# Patient Record
Sex: Male | Born: 1970 | Race: White | Hispanic: No | Marital: Single | Smoking: Never smoker
Health system: Southern US, Community
[De-identification: ages and names within clinical notes are randomized; demographics above are authoritative.]

---

## 2016-11-05 DIAGNOSIS — L0390 Cellulitis, unspecified: Secondary | ICD-10-CM | POA: Diagnosis not present

## 2017-01-13 DIAGNOSIS — Z23 Encounter for immunization: Secondary | ICD-10-CM | POA: Diagnosis not present

## 2017-01-13 DIAGNOSIS — Z131 Encounter for screening for diabetes mellitus: Secondary | ICD-10-CM | POA: Diagnosis not present

## 2017-01-13 DIAGNOSIS — F17220 Nicotine dependence, chewing tobacco, uncomplicated: Secondary | ICD-10-CM | POA: Diagnosis not present

## 2017-01-13 DIAGNOSIS — E669 Obesity, unspecified: Secondary | ICD-10-CM | POA: Diagnosis not present

## 2017-01-13 DIAGNOSIS — Z6831 Body mass index (BMI) 31.0-31.9, adult: Secondary | ICD-10-CM | POA: Diagnosis not present

## 2017-01-13 DIAGNOSIS — Z0000 Encounter for general adult medical examination without abnormal findings: Secondary | ICD-10-CM | POA: Diagnosis not present

## 2017-01-28 DIAGNOSIS — E11622 Type 2 diabetes mellitus with other skin ulcer: Secondary | ICD-10-CM | POA: Diagnosis not present

## 2017-01-28 DIAGNOSIS — Z1389 Encounter for screening for other disorder: Secondary | ICD-10-CM | POA: Diagnosis not present

## 2017-01-28 DIAGNOSIS — Z6830 Body mass index (BMI) 30.0-30.9, adult: Secondary | ICD-10-CM | POA: Diagnosis not present

## 2017-01-28 DIAGNOSIS — L97322 Non-pressure chronic ulcer of left ankle with fat layer exposed: Secondary | ICD-10-CM | POA: Diagnosis not present

## 2017-02-03 DIAGNOSIS — L97319 Non-pressure chronic ulcer of right ankle with unspecified severity: Secondary | ICD-10-CM | POA: Diagnosis not present

## 2017-02-03 DIAGNOSIS — E11622 Type 2 diabetes mellitus with other skin ulcer: Secondary | ICD-10-CM | POA: Diagnosis not present

## 2017-02-03 DIAGNOSIS — Z7984 Long term (current) use of oral hypoglycemic drugs: Secondary | ICD-10-CM | POA: Diagnosis not present

## 2017-02-03 DIAGNOSIS — E11621 Type 2 diabetes mellitus with foot ulcer: Secondary | ICD-10-CM | POA: Diagnosis not present

## 2017-02-03 DIAGNOSIS — L97322 Non-pressure chronic ulcer of left ankle with fat layer exposed: Secondary | ICD-10-CM | POA: Diagnosis not present

## 2017-02-03 DIAGNOSIS — L97522 Non-pressure chronic ulcer of other part of left foot with fat layer exposed: Secondary | ICD-10-CM | POA: Diagnosis not present

## 2017-02-10 DIAGNOSIS — E11621 Type 2 diabetes mellitus with foot ulcer: Secondary | ICD-10-CM | POA: Diagnosis not present

## 2017-02-10 DIAGNOSIS — E11622 Type 2 diabetes mellitus with other skin ulcer: Secondary | ICD-10-CM | POA: Diagnosis not present

## 2017-02-10 DIAGNOSIS — L97522 Non-pressure chronic ulcer of other part of left foot with fat layer exposed: Secondary | ICD-10-CM | POA: Diagnosis not present

## 2017-02-10 DIAGNOSIS — L97322 Non-pressure chronic ulcer of left ankle with fat layer exposed: Secondary | ICD-10-CM | POA: Diagnosis not present

## 2017-02-17 DIAGNOSIS — L97322 Non-pressure chronic ulcer of left ankle with fat layer exposed: Secondary | ICD-10-CM | POA: Diagnosis not present

## 2017-02-17 DIAGNOSIS — E11621 Type 2 diabetes mellitus with foot ulcer: Secondary | ICD-10-CM | POA: Diagnosis not present

## 2017-02-17 DIAGNOSIS — E11622 Type 2 diabetes mellitus with other skin ulcer: Secondary | ICD-10-CM | POA: Diagnosis not present

## 2017-02-17 DIAGNOSIS — L97522 Non-pressure chronic ulcer of other part of left foot with fat layer exposed: Secondary | ICD-10-CM | POA: Diagnosis not present

## 2017-02-24 DIAGNOSIS — E669 Obesity, unspecified: Secondary | ICD-10-CM | POA: Diagnosis not present

## 2017-02-24 DIAGNOSIS — E1151 Type 2 diabetes mellitus with diabetic peripheral angiopathy without gangrene: Secondary | ICD-10-CM | POA: Diagnosis not present

## 2017-02-24 DIAGNOSIS — L97322 Non-pressure chronic ulcer of left ankle with fat layer exposed: Secondary | ICD-10-CM | POA: Diagnosis not present

## 2017-02-24 DIAGNOSIS — E11622 Type 2 diabetes mellitus with other skin ulcer: Secondary | ICD-10-CM | POA: Diagnosis not present

## 2017-02-24 DIAGNOSIS — L97522 Non-pressure chronic ulcer of other part of left foot with fat layer exposed: Secondary | ICD-10-CM | POA: Diagnosis not present

## 2017-02-24 DIAGNOSIS — I10 Essential (primary) hypertension: Secondary | ICD-10-CM | POA: Diagnosis not present

## 2017-02-24 DIAGNOSIS — E782 Mixed hyperlipidemia: Secondary | ICD-10-CM | POA: Diagnosis not present

## 2017-02-24 DIAGNOSIS — E11621 Type 2 diabetes mellitus with foot ulcer: Secondary | ICD-10-CM | POA: Diagnosis not present

## 2017-02-28 DIAGNOSIS — I82433 Acute embolism and thrombosis of popliteal vein, bilateral: Secondary | ICD-10-CM | POA: Diagnosis not present

## 2017-02-28 DIAGNOSIS — I739 Peripheral vascular disease, unspecified: Secondary | ICD-10-CM | POA: Diagnosis not present

## 2017-02-28 DIAGNOSIS — L97509 Non-pressure chronic ulcer of other part of unspecified foot with unspecified severity: Secondary | ICD-10-CM | POA: Diagnosis not present

## 2017-02-28 DIAGNOSIS — E11621 Type 2 diabetes mellitus with foot ulcer: Secondary | ICD-10-CM | POA: Diagnosis not present

## 2017-03-03 DIAGNOSIS — E11622 Type 2 diabetes mellitus with other skin ulcer: Secondary | ICD-10-CM | POA: Diagnosis not present

## 2017-03-03 DIAGNOSIS — L97322 Non-pressure chronic ulcer of left ankle with fat layer exposed: Secondary | ICD-10-CM | POA: Diagnosis not present

## 2017-03-17 DIAGNOSIS — E11622 Type 2 diabetes mellitus with other skin ulcer: Secondary | ICD-10-CM | POA: Diagnosis not present

## 2017-03-17 DIAGNOSIS — L97322 Non-pressure chronic ulcer of left ankle with fat layer exposed: Secondary | ICD-10-CM | POA: Diagnosis not present

## 2017-03-18 DIAGNOSIS — I82433 Acute embolism and thrombosis of popliteal vein, bilateral: Secondary | ICD-10-CM | POA: Diagnosis not present

## 2017-03-18 DIAGNOSIS — E11622 Type 2 diabetes mellitus with other skin ulcer: Secondary | ICD-10-CM | POA: Diagnosis not present

## 2017-03-18 DIAGNOSIS — L97323 Non-pressure chronic ulcer of left ankle with necrosis of muscle: Secondary | ICD-10-CM | POA: Diagnosis not present

## 2017-03-18 DIAGNOSIS — E11621 Type 2 diabetes mellitus with foot ulcer: Secondary | ICD-10-CM | POA: Diagnosis not present

## 2017-03-22 DIAGNOSIS — E11621 Type 2 diabetes mellitus with foot ulcer: Secondary | ICD-10-CM | POA: Diagnosis not present

## 2017-03-22 DIAGNOSIS — I739 Peripheral vascular disease, unspecified: Secondary | ICD-10-CM | POA: Diagnosis not present

## 2017-03-22 DIAGNOSIS — L97529 Non-pressure chronic ulcer of other part of left foot with unspecified severity: Secondary | ICD-10-CM | POA: Diagnosis not present

## 2017-03-22 DIAGNOSIS — L97929 Non-pressure chronic ulcer of unspecified part of left lower leg with unspecified severity: Secondary | ICD-10-CM | POA: Diagnosis not present

## 2017-03-31 DIAGNOSIS — E11622 Type 2 diabetes mellitus with other skin ulcer: Secondary | ICD-10-CM | POA: Diagnosis not present

## 2017-03-31 DIAGNOSIS — L97322 Non-pressure chronic ulcer of left ankle with fat layer exposed: Secondary | ICD-10-CM | POA: Diagnosis not present

## 2017-04-07 DIAGNOSIS — L97309 Non-pressure chronic ulcer of unspecified ankle with unspecified severity: Secondary | ICD-10-CM | POA: Diagnosis not present

## 2017-04-07 DIAGNOSIS — E1151 Type 2 diabetes mellitus with diabetic peripheral angiopathy without gangrene: Secondary | ICD-10-CM | POA: Diagnosis not present

## 2017-04-07 DIAGNOSIS — E782 Mixed hyperlipidemia: Secondary | ICD-10-CM | POA: Diagnosis not present

## 2017-04-07 DIAGNOSIS — E11622 Type 2 diabetes mellitus with other skin ulcer: Secondary | ICD-10-CM | POA: Diagnosis not present

## 2017-04-07 DIAGNOSIS — L97322 Non-pressure chronic ulcer of left ankle with fat layer exposed: Secondary | ICD-10-CM | POA: Diagnosis not present

## 2017-04-14 DIAGNOSIS — L97322 Non-pressure chronic ulcer of left ankle with fat layer exposed: Secondary | ICD-10-CM | POA: Diagnosis not present

## 2017-04-14 DIAGNOSIS — E11622 Type 2 diabetes mellitus with other skin ulcer: Secondary | ICD-10-CM | POA: Diagnosis not present

## 2017-04-21 DIAGNOSIS — E11622 Type 2 diabetes mellitus with other skin ulcer: Secondary | ICD-10-CM | POA: Diagnosis not present

## 2017-04-21 DIAGNOSIS — L97322 Non-pressure chronic ulcer of left ankle with fat layer exposed: Secondary | ICD-10-CM | POA: Diagnosis not present

## 2017-04-21 DIAGNOSIS — M25572 Pain in left ankle and joints of left foot: Secondary | ICD-10-CM | POA: Diagnosis not present

## 2017-04-25 DIAGNOSIS — E11621 Type 2 diabetes mellitus with foot ulcer: Secondary | ICD-10-CM | POA: Diagnosis not present

## 2017-04-25 DIAGNOSIS — E11622 Type 2 diabetes mellitus with other skin ulcer: Secondary | ICD-10-CM | POA: Diagnosis not present

## 2017-04-25 DIAGNOSIS — L97329 Non-pressure chronic ulcer of left ankle with unspecified severity: Secondary | ICD-10-CM | POA: Diagnosis not present

## 2017-04-26 DIAGNOSIS — E11621 Type 2 diabetes mellitus with foot ulcer: Secondary | ICD-10-CM | POA: Diagnosis not present

## 2017-04-26 DIAGNOSIS — L97329 Non-pressure chronic ulcer of left ankle with unspecified severity: Secondary | ICD-10-CM | POA: Diagnosis not present

## 2017-04-27 DIAGNOSIS — L97326 Non-pressure chronic ulcer of left ankle with bone involvement without evidence of necrosis: Secondary | ICD-10-CM | POA: Diagnosis not present

## 2017-04-27 DIAGNOSIS — E11621 Type 2 diabetes mellitus with foot ulcer: Secondary | ICD-10-CM | POA: Diagnosis not present

## 2017-04-27 DIAGNOSIS — E11622 Type 2 diabetes mellitus with other skin ulcer: Secondary | ICD-10-CM | POA: Diagnosis not present

## 2017-04-27 DIAGNOSIS — L97329 Non-pressure chronic ulcer of left ankle with unspecified severity: Secondary | ICD-10-CM | POA: Diagnosis not present

## 2017-04-28 DIAGNOSIS — E11621 Type 2 diabetes mellitus with foot ulcer: Secondary | ICD-10-CM | POA: Diagnosis not present

## 2017-04-28 DIAGNOSIS — L97329 Non-pressure chronic ulcer of left ankle with unspecified severity: Secondary | ICD-10-CM | POA: Diagnosis not present

## 2017-04-29 DIAGNOSIS — E11621 Type 2 diabetes mellitus with foot ulcer: Secondary | ICD-10-CM | POA: Diagnosis not present

## 2017-04-29 DIAGNOSIS — L97326 Non-pressure chronic ulcer of left ankle with bone involvement without evidence of necrosis: Secondary | ICD-10-CM | POA: Diagnosis not present

## 2017-04-29 DIAGNOSIS — L97329 Non-pressure chronic ulcer of left ankle with unspecified severity: Secondary | ICD-10-CM | POA: Diagnosis not present

## 2017-04-29 DIAGNOSIS — E11622 Type 2 diabetes mellitus with other skin ulcer: Secondary | ICD-10-CM | POA: Diagnosis not present

## 2017-04-30 DIAGNOSIS — L97329 Non-pressure chronic ulcer of left ankle with unspecified severity: Secondary | ICD-10-CM | POA: Diagnosis not present

## 2017-04-30 DIAGNOSIS — E11621 Type 2 diabetes mellitus with foot ulcer: Secondary | ICD-10-CM | POA: Diagnosis not present

## 2017-05-01 DIAGNOSIS — L97329 Non-pressure chronic ulcer of left ankle with unspecified severity: Secondary | ICD-10-CM | POA: Diagnosis not present

## 2017-05-01 DIAGNOSIS — E11621 Type 2 diabetes mellitus with foot ulcer: Secondary | ICD-10-CM | POA: Diagnosis not present

## 2017-05-02 DIAGNOSIS — L97326 Non-pressure chronic ulcer of left ankle with bone involvement without evidence of necrosis: Secondary | ICD-10-CM | POA: Diagnosis not present

## 2017-05-02 DIAGNOSIS — E11622 Type 2 diabetes mellitus with other skin ulcer: Secondary | ICD-10-CM | POA: Diagnosis not present

## 2017-05-02 DIAGNOSIS — L97329 Non-pressure chronic ulcer of left ankle with unspecified severity: Secondary | ICD-10-CM | POA: Diagnosis not present

## 2017-05-02 DIAGNOSIS — E11621 Type 2 diabetes mellitus with foot ulcer: Secondary | ICD-10-CM | POA: Diagnosis not present

## 2017-05-03 DIAGNOSIS — L97329 Non-pressure chronic ulcer of left ankle with unspecified severity: Secondary | ICD-10-CM | POA: Diagnosis not present

## 2017-05-03 DIAGNOSIS — E11621 Type 2 diabetes mellitus with foot ulcer: Secondary | ICD-10-CM | POA: Diagnosis not present

## 2017-05-04 DIAGNOSIS — E11621 Type 2 diabetes mellitus with foot ulcer: Secondary | ICD-10-CM | POA: Diagnosis not present

## 2017-05-04 DIAGNOSIS — E11622 Type 2 diabetes mellitus with other skin ulcer: Secondary | ICD-10-CM | POA: Diagnosis not present

## 2017-05-04 DIAGNOSIS — L97322 Non-pressure chronic ulcer of left ankle with fat layer exposed: Secondary | ICD-10-CM | POA: Diagnosis not present

## 2017-05-04 DIAGNOSIS — L97329 Non-pressure chronic ulcer of left ankle with unspecified severity: Secondary | ICD-10-CM | POA: Diagnosis not present

## 2017-05-05 DIAGNOSIS — L97329 Non-pressure chronic ulcer of left ankle with unspecified severity: Secondary | ICD-10-CM | POA: Diagnosis not present

## 2017-05-05 DIAGNOSIS — E11621 Type 2 diabetes mellitus with foot ulcer: Secondary | ICD-10-CM | POA: Diagnosis not present

## 2017-05-06 DIAGNOSIS — L97322 Non-pressure chronic ulcer of left ankle with fat layer exposed: Secondary | ICD-10-CM | POA: Diagnosis not present

## 2017-05-06 DIAGNOSIS — E11622 Type 2 diabetes mellitus with other skin ulcer: Secondary | ICD-10-CM | POA: Diagnosis not present

## 2017-05-10 DIAGNOSIS — L97328 Non-pressure chronic ulcer of left ankle with other specified severity: Secondary | ICD-10-CM | POA: Diagnosis not present

## 2017-05-10 DIAGNOSIS — E11622 Type 2 diabetes mellitus with other skin ulcer: Secondary | ICD-10-CM | POA: Diagnosis not present

## 2017-05-10 DIAGNOSIS — L97322 Non-pressure chronic ulcer of left ankle with fat layer exposed: Secondary | ICD-10-CM | POA: Diagnosis not present

## 2017-05-12 DIAGNOSIS — M86172 Other acute osteomyelitis, left ankle and foot: Secondary | ICD-10-CM | POA: Diagnosis not present

## 2017-05-12 DIAGNOSIS — L97322 Non-pressure chronic ulcer of left ankle with fat layer exposed: Secondary | ICD-10-CM | POA: Diagnosis not present

## 2017-05-12 DIAGNOSIS — E11622 Type 2 diabetes mellitus with other skin ulcer: Secondary | ICD-10-CM | POA: Diagnosis not present

## 2017-05-13 DIAGNOSIS — I739 Peripheral vascular disease, unspecified: Secondary | ICD-10-CM | POA: Diagnosis not present

## 2017-05-13 DIAGNOSIS — Z79899 Other long term (current) drug therapy: Secondary | ICD-10-CM | POA: Diagnosis not present

## 2017-05-13 DIAGNOSIS — I771 Stricture of artery: Secondary | ICD-10-CM | POA: Diagnosis not present

## 2017-05-19 ENCOUNTER — Telehealth: Payer: Self-pay | Admitting: *Deleted

## 2017-05-19 ENCOUNTER — Ambulatory Visit (INDEPENDENT_AMBULATORY_CARE_PROVIDER_SITE_OTHER): Payer: BLUE CROSS/BLUE SHIELD | Admitting: Internal Medicine

## 2017-05-19 ENCOUNTER — Encounter: Payer: Self-pay | Admitting: Internal Medicine

## 2017-05-19 DIAGNOSIS — E11621 Type 2 diabetes mellitus with foot ulcer: Secondary | ICD-10-CM | POA: Diagnosis not present

## 2017-05-19 DIAGNOSIS — L97522 Non-pressure chronic ulcer of other part of left foot with fat layer exposed: Secondary | ICD-10-CM | POA: Diagnosis not present

## 2017-05-19 DIAGNOSIS — L97322 Non-pressure chronic ulcer of left ankle with fat layer exposed: Secondary | ICD-10-CM | POA: Diagnosis not present

## 2017-05-19 DIAGNOSIS — M869 Osteomyelitis, unspecified: Secondary | ICD-10-CM | POA: Insufficient documentation

## 2017-05-19 DIAGNOSIS — M86272 Subacute osteomyelitis, left ankle and foot: Secondary | ICD-10-CM | POA: Diagnosis not present

## 2017-05-19 DIAGNOSIS — E11622 Type 2 diabetes mellitus with other skin ulcer: Secondary | ICD-10-CM | POA: Diagnosis not present

## 2017-05-19 NOTE — Telephone Encounter (Signed)
Darl PikesSusan at [facility] is having difficulty getting in touch with the patient to schedule his PICC placement. She is asking for alternative contact information. She will continue to call him.  RN gave Darl PikesSusan the patient's emergency contact number (mother Coy SaunasRosemary). Andree CossHowell, Octivia Canion M, RN

## 2017-05-19 NOTE — Telephone Encounter (Signed)
Spoke to Vernona RiegerLaura at [facility] and order faxed to [phone] for picc line to be placed tomorrow afternoon. [facility] Ancef 2 grams [facility]. Orders and notes also faxed to [facility] for IV medication management and they will use an outside agency for nursing.

## 2017-05-19 NOTE — Telephone Encounter (Signed)
Called patient and IR was able to reach him. Picc is to be placed tomorrow at 1:30 pm. [facility] is still working on obtaining nursing. They are checking to see if Pam can meet [facility] at [facility] to [facility] teaching.

## 2017-05-19 NOTE — Progress Notes (Signed)
[facility] for Infectious Disease      Reason for Consult: osteomyelitis of left lateral malleolus    Referring Physician: Lawanda Cousins, NP    Patient ID: Gabriel Peters, male    DOB: February 10, 1971, 46 y.o.   MRN: [id number]  HPI:   Here for evaluation of osteomyelitis.  In May he bumped his left lateral malleolus at work and developed an ulcer.  He went to the doctor and found out he was diabetic.  The ulcer worsened and in June he was given empiric doxycycline for 2 weeks but the ulcer has not healed.  Initial xrays did not suggest osteomyelitis but follow up MRI last week c/w osteomyelitis.  No significant drainage of the area but is open.  He was also found to have bilateral peripheral arterial disease and sees Dr. Earleen Newport and underwent an arterial procedure.  He has had no associated fever, chills.  No history of osteomyelitis.    Previous record reviewed including the MRI report confirming osteomyelitis.   PMH: DM, osteomyelitis  Prior to Admission medications   Medication Sig Start Date End Date Taking? Authorizing Provider  atorvastatin (LIPITOR) 40 MG tablet Take 40 mg by mouth every evening. 04/08/17  Yes [provider]  Blood Glucose Monitoring Suppl (ONE TOUCH ULTRA 2) w/Device KIT AS DIRECTED,ONCE DAILY 04/25/17  Yes [provider]  clopidogrel (PLAVIX) 75 MG tablet Take 75 mg by mouth daily. 05/14/17  Yes [provider]  doxycycline (VIBRAMYCIN) 100 MG capsule Take 100 mg by mouth 2 (two) times daily. 05/10/17  Yes [provider]  losartan (COZAAR) 50 MG tablet Take 50 mg by mouth daily.   Yes [provider]  metFORMIN (GLUCOPHAGE) 850 MG tablet Take 850 mg by mouth 2 (two) times daily. 04/08/17  Yes [provider]  ONE TOUCH ULTRA TEST test strip AS DIRECTED, ONCE DAILY 04/25/17  Yes [provider]  Kayenta AS DIRECTED ONCE DAILY 04/25/17  Yes [provider]  SANTYL ointment   03/21/17  Yes [provider]  traMADol Veatrice Bourbon) 50 MG tablet  05/16/17  Yes [provider]    No Known Allergies  Social History  Substance Use Topics  . Smoking status: Never Smoker  . Smokeless tobacco: Current User    Types: Snuff  . Alcohol use Yes     Comment: occasional    [place]: cardiac disease  Review of Systems  Constitutional: negative for fatigue, malaise and anorexia Gastrointestinal: negative for diarrhea Integument/breast: negative for rash All other systems reviewed and are negative    Constitutional: in no apparent distress, well developed and well nourished and alert  Vitals:   05/19/17 1023  BP: (!) 144/85  Pulse: (!) 125  Temp: 98.1 F (36.7 C)   EYES: anicteric ENMT: no thrush Cardiovascular: Cor RRR Respiratory: CTA B; normal respiratory effort GI: Bowel sounds are normal, liver is not enlarged, spleen is not enlarged Musculoskeletal: no pedal edema noted, left lateral malleolus with open wound about 1 cm Skin: negatives: no rash Hematologic: no cervical lad  Labs: No results found for: WBC, HGB, HCT, MCV, PLT No results found for: CREATININE, BUN, NA, K, CL, CO2 No results found for: ALT, AST, GGT, ALKPHOS, BILITOT, INR   Assessment: subacute osteomyelitis.  I discussed treatment options.  No bone biopsy has been done so no definitive culture.   Wound swab with MSSA.     Plan: 1) baseline CMP, cbc, ESR, CRP 2) picc  line 3) [facility] for IV ancef for 4 weeks through October 11 Follow up with me in 3-4 weeks

## 2017-05-20 DIAGNOSIS — L02818 Cutaneous abscess of other sites: Secondary | ICD-10-CM | POA: Diagnosis not present

## 2017-05-20 DIAGNOSIS — M869 Osteomyelitis, unspecified: Secondary | ICD-10-CM | POA: Diagnosis not present

## 2017-05-20 DIAGNOSIS — M868X7 Other osteomyelitis, ankle and foot: Secondary | ICD-10-CM | POA: Diagnosis not present

## 2017-05-20 LAB — COMPREHENSIVE METABOLIC PANEL
AG RATIO: 1.6 (calc) (ref 1.0–2.5)
ALKALINE PHOSPHATASE (APISO): 84 U/L (ref 40–115)
ALT: 18 U/L (ref 9–46)
AST: 15 U/L (ref 10–40)
Albumin: 4.4 g/dL (ref 3.6–5.1)
BILIRUBIN TOTAL: 0.5 mg/dL (ref 0.2–1.2)
BUN: 22 mg/dL (ref 7–25)
CALCIUM: 9.5 mg/dL (ref 8.6–10.3)
CO2: 25 mmol/L (ref 20–32)
Chloride: 100 mmol/L (ref 98–110)
Creat: 0.76 mg/dL (ref 0.60–1.35)
Globulin: 2.7 g/dL (calc) (ref 1.9–3.7)
Glucose, Bld: 225 mg/dL — ABNORMAL HIGH (ref 65–99)
POTASSIUM: 4.2 mmol/L (ref 3.5–5.3)
SODIUM: 139 mmol/L (ref 135–146)
TOTAL PROTEIN: 7.1 g/dL (ref 6.1–8.1)

## 2017-05-20 LAB — CBC WITH DIFFERENTIAL/PLATELET
BASOS ABS: 105 {cells}/uL (ref 0–200)
Basophils Relative: 0.9 %
EOS PCT: 2.7 %
Eosinophils Absolute: 316 cells/uL (ref 15–500)
HEMATOCRIT: 46.3 % (ref 38.5–50.0)
HEMOGLOBIN: 15.4 g/dL (ref 13.2–17.1)
LYMPHS ABS: 3428 {cells}/uL (ref 850–3900)
MCH: 29.4 pg (ref 27.0–33.0)
MCHC: 33.3 g/dL (ref 32.0–36.0)
MCV: 88.5 fL (ref 80.0–100.0)
MPV: 10.5 fL (ref 7.5–12.5)
Monocytes Relative: 6.7 %
NEUTROS ABS: 7067 {cells}/uL (ref 1500–7800)
NEUTROS PCT: 60.4 %
Platelets: 282 10*3/uL (ref 140–400)
RBC: 5.23 10*6/uL (ref 4.20–5.80)
RDW: 12 % (ref 11.0–15.0)
Total Lymphocyte: 29.3 %
WBC: 11.7 10*3/uL — ABNORMAL HIGH (ref 3.8–10.8)
WBCMIX: 784 {cells}/uL (ref 200–950)

## 2017-05-20 LAB — C-REACTIVE PROTEIN: CRP: 10.3 mg/L — ABNORMAL HIGH (ref <8.0)

## 2017-05-20 LAB — SEDIMENTATION RATE: Sed Rate: 31 mm/h — ABNORMAL HIGH (ref 0–15)

## 2017-05-20 NOTE — Telephone Encounter (Signed)
[facility] is going to see patient on Sunday 05/22/17 and Pam is meeting him today for training.

## 2017-05-21 DIAGNOSIS — M86172 Other acute osteomyelitis, left ankle and foot: Secondary | ICD-10-CM | POA: Diagnosis not present

## 2017-05-22 DIAGNOSIS — L02818 Cutaneous abscess of other sites: Secondary | ICD-10-CM | POA: Diagnosis not present

## 2017-05-22 DIAGNOSIS — M869 Osteomyelitis, unspecified: Secondary | ICD-10-CM | POA: Diagnosis not present

## 2017-05-25 ENCOUNTER — Other Ambulatory Visit: Payer: Self-pay | Admitting: Pharmacist

## 2017-05-26 DIAGNOSIS — L97322 Non-pressure chronic ulcer of left ankle with fat layer exposed: Secondary | ICD-10-CM | POA: Diagnosis not present

## 2017-05-26 DIAGNOSIS — L97522 Non-pressure chronic ulcer of other part of left foot with fat layer exposed: Secondary | ICD-10-CM | POA: Diagnosis not present

## 2017-05-26 DIAGNOSIS — E11622 Type 2 diabetes mellitus with other skin ulcer: Secondary | ICD-10-CM | POA: Diagnosis not present

## 2017-05-26 DIAGNOSIS — E11621 Type 2 diabetes mellitus with foot ulcer: Secondary | ICD-10-CM | POA: Diagnosis not present

## 2017-05-27 DIAGNOSIS — L02818 Cutaneous abscess of other sites: Secondary | ICD-10-CM | POA: Diagnosis not present

## 2017-05-30 DIAGNOSIS — Z452 Encounter for adjustment and management of vascular access device: Secondary | ICD-10-CM | POA: Diagnosis not present

## 2017-05-30 DIAGNOSIS — E11622 Type 2 diabetes mellitus with other skin ulcer: Secondary | ICD-10-CM | POA: Diagnosis not present

## 2017-05-31 DIAGNOSIS — M8610 Other acute osteomyelitis, unspecified site: Secondary | ICD-10-CM | POA: Diagnosis not present

## 2017-05-31 DIAGNOSIS — M869 Osteomyelitis, unspecified: Secondary | ICD-10-CM | POA: Diagnosis not present

## 2017-06-01 ENCOUNTER — Other Ambulatory Visit: Payer: Self-pay | Admitting: Pharmacist

## 2017-06-01 DIAGNOSIS — E11621 Type 2 diabetes mellitus with foot ulcer: Secondary | ICD-10-CM | POA: Diagnosis not present

## 2017-06-01 DIAGNOSIS — E11622 Type 2 diabetes mellitus with other skin ulcer: Secondary | ICD-10-CM | POA: Diagnosis not present

## 2017-06-01 DIAGNOSIS — L97522 Non-pressure chronic ulcer of other part of left foot with fat layer exposed: Secondary | ICD-10-CM | POA: Diagnosis not present

## 2017-06-01 DIAGNOSIS — L97322 Non-pressure chronic ulcer of left ankle with fat layer exposed: Secondary | ICD-10-CM | POA: Diagnosis not present

## 2017-06-06 DIAGNOSIS — L02818 Cutaneous abscess of other sites: Secondary | ICD-10-CM | POA: Diagnosis not present

## 2017-06-06 DIAGNOSIS — M8610 Other acute osteomyelitis, unspecified site: Secondary | ICD-10-CM | POA: Diagnosis not present

## 2017-06-06 DIAGNOSIS — M869 Osteomyelitis, unspecified: Secondary | ICD-10-CM | POA: Diagnosis not present

## 2017-06-08 ENCOUNTER — Other Ambulatory Visit: Payer: Self-pay | Admitting: Pharmacist

## 2017-06-08 DIAGNOSIS — E11621 Type 2 diabetes mellitus with foot ulcer: Secondary | ICD-10-CM | POA: Diagnosis not present

## 2017-06-08 DIAGNOSIS — L97322 Non-pressure chronic ulcer of left ankle with fat layer exposed: Secondary | ICD-10-CM | POA: Diagnosis not present

## 2017-06-08 DIAGNOSIS — L97522 Non-pressure chronic ulcer of other part of left foot with fat layer exposed: Secondary | ICD-10-CM | POA: Diagnosis not present

## 2017-06-08 DIAGNOSIS — E11622 Type 2 diabetes mellitus with other skin ulcer: Secondary | ICD-10-CM | POA: Diagnosis not present

## 2017-06-10 ENCOUNTER — Other Ambulatory Visit: Payer: Self-pay | Admitting: Pharmacist

## 2017-06-10 DIAGNOSIS — L02818 Cutaneous abscess of other sites: Secondary | ICD-10-CM | POA: Diagnosis not present

## 2017-06-13 ENCOUNTER — Telehealth: Payer: Self-pay | Admitting: *Deleted

## 2017-06-13 ENCOUNTER — Other Ambulatory Visit: Payer: Self-pay | Admitting: *Deleted

## 2017-06-13 DIAGNOSIS — L97522 Non-pressure chronic ulcer of other part of left foot with fat layer exposed: Secondary | ICD-10-CM | POA: Diagnosis not present

## 2017-06-13 DIAGNOSIS — L97322 Non-pressure chronic ulcer of left ankle with fat layer exposed: Secondary | ICD-10-CM | POA: Diagnosis not present

## 2017-06-13 DIAGNOSIS — E11621 Type 2 diabetes mellitus with foot ulcer: Secondary | ICD-10-CM | POA: Diagnosis not present

## 2017-06-13 DIAGNOSIS — E11622 Type 2 diabetes mellitus with other skin ulcer: Secondary | ICD-10-CM | POA: Diagnosis not present

## 2017-06-13 MED ORDER — CEPHALEXIN 500 MG PO CAPS: 500.0000 mg | ORAL_CAPSULE | Freq: Four times a day (QID) | ORAL | 0 refills | Status: DC

## 2017-06-13 NOTE — Telephone Encounter (Addendum)
Patient cancelled his appt for 06/16/17 because of a test he is having done at Dr. Gabriel Rung office. He thinks it may be a doppler. His end date for IV antibiotic is 06/16/17 and his follow up is 06/22/17. Can picc line be pulled?

## 2017-06-13 NOTE — Telephone Encounter (Signed)
Verbal order per Dr. Luciana Axe given to [facility] at [facility] to pull patient's picc line at the end of IV antibiotics on 06/16/17. Left patient a voice mail to find out what pharmacy to send the keflex to.

## 2017-06-13 NOTE — Telephone Encounter (Signed)
Rx for Keflex sent to [facility] in [place] and patient notified to start this after the picc line is pulled on 06/16/17.

## 2017-06-13 NOTE — Telephone Encounter (Signed)
Yes, pull the picc line.  Have him start Keflex 500 mg 4 times per day after the picc line is pulled for 14 days and we can reassess at the appt.  thanks

## 2017-06-14 DIAGNOSIS — L02818 Cutaneous abscess of other sites: Secondary | ICD-10-CM | POA: Diagnosis not present

## 2017-06-14 DIAGNOSIS — E119 Type 2 diabetes mellitus without complications: Secondary | ICD-10-CM | POA: Diagnosis not present

## 2017-06-14 DIAGNOSIS — M86272 Subacute osteomyelitis, left ankle and foot: Secondary | ICD-10-CM | POA: Diagnosis not present

## 2017-06-14 DIAGNOSIS — L97322 Non-pressure chronic ulcer of left ankle with fat layer exposed: Secondary | ICD-10-CM | POA: Diagnosis not present

## 2017-06-14 DIAGNOSIS — L97329 Non-pressure chronic ulcer of left ankle with unspecified severity: Secondary | ICD-10-CM | POA: Diagnosis not present

## 2017-06-14 DIAGNOSIS — E11622 Type 2 diabetes mellitus with other skin ulcer: Secondary | ICD-10-CM | POA: Diagnosis not present

## 2017-06-16 ENCOUNTER — Ambulatory Visit: Payer: BLUE CROSS/BLUE SHIELD | Admitting: Internal Medicine

## 2017-06-16 DIAGNOSIS — L97322 Non-pressure chronic ulcer of left ankle with fat layer exposed: Secondary | ICD-10-CM | POA: Diagnosis not present

## 2017-06-16 DIAGNOSIS — L97522 Non-pressure chronic ulcer of other part of left foot with fat layer exposed: Secondary | ICD-10-CM | POA: Diagnosis not present

## 2017-06-16 DIAGNOSIS — E11621 Type 2 diabetes mellitus with foot ulcer: Secondary | ICD-10-CM | POA: Diagnosis not present

## 2017-06-16 DIAGNOSIS — L89529 Pressure ulcer of left ankle, unspecified stage: Secondary | ICD-10-CM | POA: Diagnosis not present

## 2017-06-16 DIAGNOSIS — L02818 Cutaneous abscess of other sites: Secondary | ICD-10-CM | POA: Diagnosis not present

## 2017-06-16 DIAGNOSIS — Z9289 Personal history of other medical treatment: Secondary | ICD-10-CM | POA: Diagnosis not present

## 2017-06-16 DIAGNOSIS — E11622 Type 2 diabetes mellitus with other skin ulcer: Secondary | ICD-10-CM | POA: Diagnosis not present

## 2017-06-16 DIAGNOSIS — I739 Peripheral vascular disease, unspecified: Secondary | ICD-10-CM | POA: Diagnosis not present

## 2017-06-22 ENCOUNTER — Ambulatory Visit (INDEPENDENT_AMBULATORY_CARE_PROVIDER_SITE_OTHER): Payer: BLUE CROSS/BLUE SHIELD | Admitting: Internal Medicine

## 2017-06-22 ENCOUNTER — Encounter: Payer: Self-pay | Admitting: Internal Medicine

## 2017-06-22 VITALS — BP 130/89 | HR 111 | Temp 98.9°F | Wt 223.0 lb

## 2017-06-22 DIAGNOSIS — L97509 Non-pressure chronic ulcer of other part of unspecified foot with unspecified severity: Secondary | ICD-10-CM | POA: Diagnosis not present

## 2017-06-22 DIAGNOSIS — M86272 Subacute osteomyelitis, left ankle and foot: Secondary | ICD-10-CM | POA: Diagnosis not present

## 2017-06-22 DIAGNOSIS — E11621 Type 2 diabetes mellitus with foot ulcer: Secondary | ICD-10-CM

## 2017-06-22 DIAGNOSIS — E11622 Type 2 diabetes mellitus with other skin ulcer: Secondary | ICD-10-CM | POA: Diagnosis not present

## 2017-06-22 DIAGNOSIS — L97322 Non-pressure chronic ulcer of left ankle with fat layer exposed: Secondary | ICD-10-CM | POA: Diagnosis not present

## 2017-06-22 NOTE — Progress Notes (Signed)
   Subjective:    Patient ID: Gabriel Peters, male    DOB: 07/14/1971, 46 y.o.   MRN: [id number]  HPI Here for follow up of subacute osteomyelitis.   He completed 6 weeks of IV cefazolin and improved his wound.  Inflammatory markers normalized from minimally elevated.  Wound is shrinking.  No associated n/v.    Review of Systems  Constitutional: Negative for fatigue and fever.  Gastrointestinal: Negative for diarrhea.  Skin: Negative for rash.       Objective:   Physical Exam  Constitutional: He appears well-developed and well-nourished. No distress.  Eyes: No scleral icterus.  Cardiovascular: Normal rate, regular rhythm and normal heart sounds.   No murmur heard. Lymphadenopathy:    He has no cervical adenopathy.  Skin: No rash noted.          Assessment & Plan:

## 2017-06-22 NOTE — Assessment & Plan Note (Signed)
Now wound is healing, inflammatory markers ok no surrounding erythema on picture suggesting healed osteomyelitis.   rtc as needed

## 2017-06-22 NOTE — Assessment & Plan Note (Signed)
Needs continued good diabetes control

## 2017-06-29 DIAGNOSIS — E11622 Type 2 diabetes mellitus with other skin ulcer: Secondary | ICD-10-CM | POA: Diagnosis not present

## 2017-06-29 DIAGNOSIS — L97322 Non-pressure chronic ulcer of left ankle with fat layer exposed: Secondary | ICD-10-CM | POA: Diagnosis not present

## 2017-07-06 DIAGNOSIS — L97322 Non-pressure chronic ulcer of left ankle with fat layer exposed: Secondary | ICD-10-CM | POA: Diagnosis not present

## 2017-07-06 DIAGNOSIS — E11622 Type 2 diabetes mellitus with other skin ulcer: Secondary | ICD-10-CM | POA: Diagnosis not present

## 2017-07-13 DIAGNOSIS — E11622 Type 2 diabetes mellitus with other skin ulcer: Secondary | ICD-10-CM | POA: Diagnosis not present

## 2017-07-13 DIAGNOSIS — L97322 Non-pressure chronic ulcer of left ankle with fat layer exposed: Secondary | ICD-10-CM | POA: Diagnosis not present

## 2017-07-20 DIAGNOSIS — L97322 Non-pressure chronic ulcer of left ankle with fat layer exposed: Secondary | ICD-10-CM | POA: Diagnosis not present

## 2017-07-20 DIAGNOSIS — E11622 Type 2 diabetes mellitus with other skin ulcer: Secondary | ICD-10-CM | POA: Diagnosis not present

## 2017-08-03 DIAGNOSIS — E11622 Type 2 diabetes mellitus with other skin ulcer: Secondary | ICD-10-CM | POA: Diagnosis not present

## 2017-08-03 DIAGNOSIS — L97329 Non-pressure chronic ulcer of left ankle with unspecified severity: Secondary | ICD-10-CM | POA: Diagnosis not present

## 2017-10-14 DIAGNOSIS — E669 Obesity, unspecified: Secondary | ICD-10-CM | POA: Diagnosis not present

## 2017-10-14 DIAGNOSIS — I10 Essential (primary) hypertension: Secondary | ICD-10-CM | POA: Diagnosis not present

## 2017-10-14 DIAGNOSIS — E1151 Type 2 diabetes mellitus with diabetic peripheral angiopathy without gangrene: Secondary | ICD-10-CM | POA: Diagnosis not present

## 2017-10-14 DIAGNOSIS — E782 Mixed hyperlipidemia: Secondary | ICD-10-CM | POA: Diagnosis not present

## 2018-01-17 DIAGNOSIS — I771 Stricture of artery: Secondary | ICD-10-CM | POA: Diagnosis not present

## 2018-01-17 DIAGNOSIS — I739 Peripheral vascular disease, unspecified: Secondary | ICD-10-CM | POA: Diagnosis not present

## 2018-10-12 DIAGNOSIS — I10 Essential (primary) hypertension: Secondary | ICD-10-CM | POA: Diagnosis not present

## 2018-10-12 DIAGNOSIS — E782 Mixed hyperlipidemia: Secondary | ICD-10-CM | POA: Diagnosis not present

## 2018-10-12 DIAGNOSIS — Z23 Encounter for immunization: Secondary | ICD-10-CM | POA: Diagnosis not present

## 2018-10-12 DIAGNOSIS — M722 Plantar fascial fibromatosis: Secondary | ICD-10-CM | POA: Diagnosis not present

## 2018-10-12 DIAGNOSIS — E1151 Type 2 diabetes mellitus with diabetic peripheral angiopathy without gangrene: Secondary | ICD-10-CM | POA: Diagnosis not present

## 2018-11-14 DIAGNOSIS — I739 Peripheral vascular disease, unspecified: Secondary | ICD-10-CM | POA: Diagnosis not present

## 2019-01-15 DIAGNOSIS — E669 Obesity, unspecified: Secondary | ICD-10-CM | POA: Diagnosis not present

## 2019-01-15 DIAGNOSIS — I10 Essential (primary) hypertension: Secondary | ICD-10-CM | POA: Diagnosis not present

## 2019-01-15 DIAGNOSIS — E782 Mixed hyperlipidemia: Secondary | ICD-10-CM | POA: Diagnosis not present

## 2019-01-15 DIAGNOSIS — E1151 Type 2 diabetes mellitus with diabetic peripheral angiopathy without gangrene: Secondary | ICD-10-CM | POA: Diagnosis not present

## 2020-02-11 DIAGNOSIS — E782 Mixed hyperlipidemia: Secondary | ICD-10-CM | POA: Diagnosis not present

## 2020-02-11 DIAGNOSIS — E1151 Type 2 diabetes mellitus with diabetic peripheral angiopathy without gangrene: Secondary | ICD-10-CM | POA: Diagnosis not present

## 2020-02-11 DIAGNOSIS — I10 Essential (primary) hypertension: Secondary | ICD-10-CM | POA: Diagnosis not present

## 2020-06-09 DIAGNOSIS — E782 Mixed hyperlipidemia: Secondary | ICD-10-CM | POA: Diagnosis not present

## 2020-06-09 DIAGNOSIS — I10 Essential (primary) hypertension: Secondary | ICD-10-CM | POA: Diagnosis not present

## 2020-06-09 DIAGNOSIS — E1151 Type 2 diabetes mellitus with diabetic peripheral angiopathy without gangrene: Secondary | ICD-10-CM | POA: Diagnosis not present

## 2020-06-09 DIAGNOSIS — Z2821 Immunization not carried out because of patient refusal: Secondary | ICD-10-CM | POA: Diagnosis not present

## 2020-09-24 DIAGNOSIS — R051 Acute cough: Secondary | ICD-10-CM | POA: Diagnosis not present

## 2020-09-24 DIAGNOSIS — Z20828 Contact with and (suspected) exposure to other viral communicable diseases: Secondary | ICD-10-CM | POA: Diagnosis not present

## 2020-09-24 DIAGNOSIS — R509 Fever, unspecified: Secondary | ICD-10-CM | POA: Diagnosis not present

## 2021-10-21 DIAGNOSIS — E11622 Type 2 diabetes mellitus with other skin ulcer: Secondary | ICD-10-CM | POA: Diagnosis not present

## 2021-10-21 DIAGNOSIS — L97309 Non-pressure chronic ulcer of unspecified ankle with unspecified severity: Secondary | ICD-10-CM | POA: Diagnosis not present

## 2021-10-21 DIAGNOSIS — E1151 Type 2 diabetes mellitus with diabetic peripheral angiopathy without gangrene: Secondary | ICD-10-CM | POA: Diagnosis not present

## 2021-10-21 DIAGNOSIS — E782 Mixed hyperlipidemia: Secondary | ICD-10-CM | POA: Diagnosis not present

## 2021-10-29 DIAGNOSIS — E11622 Type 2 diabetes mellitus with other skin ulcer: Secondary | ICD-10-CM | POA: Diagnosis not present

## 2021-10-29 DIAGNOSIS — L97329 Non-pressure chronic ulcer of left ankle with unspecified severity: Secondary | ICD-10-CM | POA: Diagnosis not present

## 2021-11-03 DIAGNOSIS — L97329 Non-pressure chronic ulcer of left ankle with unspecified severity: Secondary | ICD-10-CM | POA: Diagnosis not present

## 2021-11-03 DIAGNOSIS — E11622 Type 2 diabetes mellitus with other skin ulcer: Secondary | ICD-10-CM | POA: Diagnosis not present

## 2021-11-10 DIAGNOSIS — L97528 Non-pressure chronic ulcer of other part of left foot with other specified severity: Secondary | ICD-10-CM | POA: Diagnosis not present

## 2021-11-10 DIAGNOSIS — E11622 Type 2 diabetes mellitus with other skin ulcer: Secondary | ICD-10-CM | POA: Diagnosis not present

## 2021-11-10 DIAGNOSIS — E11621 Type 2 diabetes mellitus with foot ulcer: Secondary | ICD-10-CM | POA: Diagnosis not present

## 2021-11-12 DIAGNOSIS — E11622 Type 2 diabetes mellitus with other skin ulcer: Secondary | ICD-10-CM | POA: Diagnosis not present

## 2021-11-12 DIAGNOSIS — E11621 Type 2 diabetes mellitus with foot ulcer: Secondary | ICD-10-CM | POA: Diagnosis not present

## 2021-11-12 DIAGNOSIS — L97528 Non-pressure chronic ulcer of other part of left foot with other specified severity: Secondary | ICD-10-CM | POA: Diagnosis not present

## 2021-11-18 DIAGNOSIS — L97528 Non-pressure chronic ulcer of other part of left foot with other specified severity: Secondary | ICD-10-CM | POA: Diagnosis not present

## 2021-11-18 DIAGNOSIS — E11621 Type 2 diabetes mellitus with foot ulcer: Secondary | ICD-10-CM | POA: Diagnosis not present

## 2021-11-18 DIAGNOSIS — E11622 Type 2 diabetes mellitus with other skin ulcer: Secondary | ICD-10-CM | POA: Diagnosis not present

## 2021-11-23 DIAGNOSIS — I70213 Atherosclerosis of native arteries of extremities with intermittent claudication, bilateral legs: Secondary | ICD-10-CM | POA: Diagnosis not present

## 2021-11-25 DIAGNOSIS — E11622 Type 2 diabetes mellitus with other skin ulcer: Secondary | ICD-10-CM | POA: Diagnosis not present

## 2021-11-25 DIAGNOSIS — L97529 Non-pressure chronic ulcer of other part of left foot with unspecified severity: Secondary | ICD-10-CM | POA: Diagnosis not present

## 2021-11-25 DIAGNOSIS — M19072 Primary osteoarthritis, left ankle and foot: Secondary | ICD-10-CM | POA: Diagnosis not present

## 2021-11-25 DIAGNOSIS — E11621 Type 2 diabetes mellitus with foot ulcer: Secondary | ICD-10-CM | POA: Diagnosis not present

## 2021-11-25 DIAGNOSIS — L97522 Non-pressure chronic ulcer of other part of left foot with fat layer exposed: Secondary | ICD-10-CM | POA: Diagnosis not present

## 2021-11-25 DIAGNOSIS — E1151 Type 2 diabetes mellitus with diabetic peripheral angiopathy without gangrene: Secondary | ICD-10-CM | POA: Diagnosis not present

## 2021-11-25 DIAGNOSIS — L97528 Non-pressure chronic ulcer of other part of left foot with other specified severity: Secondary | ICD-10-CM | POA: Diagnosis not present

## 2021-11-25 DIAGNOSIS — R600 Localized edema: Secondary | ICD-10-CM | POA: Diagnosis not present

## 2021-11-26 ENCOUNTER — Other Ambulatory Visit (HOSPITAL_COMMUNITY): Payer: Self-pay | Admitting: Interventional Radiology

## 2021-11-26 DIAGNOSIS — S91302A Unspecified open wound, left foot, initial encounter: Secondary | ICD-10-CM

## 2021-12-01 ENCOUNTER — Other Ambulatory Visit: Payer: Self-pay | Admitting: Radiology

## 2021-12-02 ENCOUNTER — Other Ambulatory Visit (HOSPITAL_COMMUNITY): Payer: Self-pay | Admitting: Interventional Radiology

## 2021-12-02 ENCOUNTER — Inpatient Hospital Stay (HOSPITAL_COMMUNITY): Admit: 2021-12-02 | Payer: BC Managed Care – PPO

## 2021-12-02 ENCOUNTER — Other Ambulatory Visit: Payer: Self-pay

## 2021-12-02 ENCOUNTER — Encounter (HOSPITAL_COMMUNITY): Payer: Self-pay

## 2021-12-02 ENCOUNTER — Observation Stay (HOSPITAL_COMMUNITY)
Admission: RE | Admit: 2021-12-02 | Discharge: 2021-12-03 | Disposition: A | Discharge status: Home / Self Care | Payer: BC Managed Care – PPO | Source: Ambulatory Visit | Attending: Interventional Radiology | Admitting: Interventional Radiology

## 2021-12-02 DIAGNOSIS — S91302A Unspecified open wound, left foot, initial encounter: Secondary | ICD-10-CM

## 2021-12-02 DIAGNOSIS — Z7984 Long term (current) use of oral hypoglycemic drugs: Secondary | ICD-10-CM | POA: Insufficient documentation

## 2021-12-02 DIAGNOSIS — E11621 Type 2 diabetes mellitus with foot ulcer: Secondary | ICD-10-CM | POA: Diagnosis not present

## 2021-12-02 DIAGNOSIS — L97529 Non-pressure chronic ulcer of other part of left foot with unspecified severity: Secondary | ICD-10-CM | POA: Insufficient documentation

## 2021-12-02 DIAGNOSIS — Z79899 Other long term (current) drug therapy: Secondary | ICD-10-CM | POA: Diagnosis not present

## 2021-12-02 DIAGNOSIS — F17290 Nicotine dependence, other tobacco product, uncomplicated: Secondary | ICD-10-CM | POA: Diagnosis not present

## 2021-12-02 DIAGNOSIS — I70262 Atherosclerosis of native arteries of extremities with gangrene, left leg: Secondary | ICD-10-CM | POA: Insufficient documentation

## 2021-12-02 DIAGNOSIS — E1152 Type 2 diabetes mellitus with diabetic peripheral angiopathy with gangrene: Secondary | ICD-10-CM | POA: Diagnosis not present

## 2021-12-02 DIAGNOSIS — Z7982 Long term (current) use of aspirin: Secondary | ICD-10-CM | POA: Diagnosis not present

## 2021-12-02 DIAGNOSIS — M869 Osteomyelitis, unspecified: Secondary | ICD-10-CM | POA: Diagnosis not present

## 2021-12-02 DIAGNOSIS — I70248 Atherosclerosis of native arteries of left leg with ulceration of other part of lower left leg: Secondary | ICD-10-CM | POA: Diagnosis not present

## 2021-12-02 DIAGNOSIS — I70229 Atherosclerosis of native arteries of extremities with rest pain, unspecified extremity: Secondary | ICD-10-CM | POA: Insufficient documentation

## 2021-12-02 HISTORY — PX: IR ANGIOGRAM SELECTIVE EACH ADDITIONAL VESSEL: IMG667

## 2021-12-02 HISTORY — PX: IR US GUIDE VASC ACCESS LEFT: IMG2389

## 2021-12-02 HISTORY — PX: IR ANGIOGRAM EXTREMITY LEFT: IMG651

## 2021-12-02 HISTORY — PX: IR TIB-PERO ART ATHEREC INC PTA MOD SED: IMG2314

## 2021-12-02 LAB — CBC
HCT: 38.8 % — ABNORMAL LOW (ref 39.0–52.0)
Hemoglobin: 12.9 g/dL — ABNORMAL LOW (ref 13.0–17.0)
MCH: 30.3 pg (ref 26.0–34.0)
MCHC: 33.2 g/dL (ref 30.0–36.0)
MCV: 91.1 fL (ref 80.0–100.0)
Platelets: 257 10*3/uL (ref 150–400)
RBC: 4.26 MIL/uL (ref 4.22–5.81)
RDW: 12.8 % (ref 11.5–15.5)
WBC: 9.1 10*3/uL (ref 4.0–10.5)
nRBC: 0 % (ref 0.0–0.2)

## 2021-12-02 LAB — BASIC METABOLIC PANEL
Anion gap: 10 (ref 5–15)
BUN: 18 mg/dL (ref 6–20)
CO2: 21 mmol/L — ABNORMAL LOW (ref 22–32)
Calcium: 8.9 mg/dL (ref 8.9–10.3)
Chloride: 109 mmol/L (ref 98–111)
Creatinine, Ser: 1.46 mg/dL — ABNORMAL HIGH (ref 0.61–1.24)
GFR, Estimated: 58 mL/min — ABNORMAL LOW (ref >60)
Glucose, Bld: 200 mg/dL — ABNORMAL HIGH (ref 70–99)
Potassium: 3.6 mmol/L (ref 3.5–5.1)
Sodium: 140 mmol/L (ref 135–145)

## 2021-12-02 LAB — PROTIME-INR
INR: 1.1 (ref 0.8–1.2)
Prothrombin Time: 13.9 seconds (ref 11.4–15.2)

## 2021-12-02 LAB — GLUCOSE, CAPILLARY: Glucose-Capillary: 176 mg/dL — ABNORMAL HIGH (ref 70–99)

## 2021-12-02 MED ORDER — MIDAZOLAM HCL 2 MG/2ML IJ SOLN
INTRAMUSCULAR | Status: AC
  Filled 2021-12-02: qty 2

## 2021-12-02 MED ORDER — HEPARIN SODIUM (PORCINE) 1000 UNIT/ML IJ SOLN
INTRAMUSCULAR | Status: AC | PRN
Start: 2021-12-02 — End: 2021-12-02
  Administered 2021-12-02: 2000 [IU] via INTRAVENOUS
  Administered 2021-12-02: 8000 [IU] via INTRAVENOUS
  Administered 2021-12-02: 2000 [IU] via INTRAVENOUS

## 2021-12-02 MED ORDER — ASPIRIN 325 MG PO TABS: 650.0000 mg | ORAL_TABLET | Freq: Once | ORAL | Status: AC

## 2021-12-02 MED ORDER — ATORVASTATIN CALCIUM 40 MG PO TABS
40.0000 mg | ORAL_TABLET | Freq: Every evening | ORAL | Status: DC
  Administered 2021-12-02: 40 mg via ORAL
  Filled 2021-12-02: qty 1

## 2021-12-02 MED ORDER — CLOPIDOGREL BISULFATE 300 MG PO TABS
ORAL_TABLET | ORAL | Status: AC
  Administered 2021-12-02: 300 mg via ORAL
  Filled 2021-12-02: qty 1

## 2021-12-02 MED ORDER — FENTANYL CITRATE (PF) 100 MCG/2ML IJ SOLN
INTRAMUSCULAR | Status: AC
  Filled 2021-12-02: qty 2

## 2021-12-02 MED ORDER — INSULIN ASPART 100 UNIT/ML IJ SOLN
0.0000 [IU] | Freq: Three times a day (TID) | INTRAMUSCULAR | Status: DC
  Administered 2021-12-02 – 2021-12-03 (×3): 3 [IU] via SUBCUTANEOUS

## 2021-12-02 MED ORDER — NITROGLYCERIN 1 MG/10 ML FOR IR/CATH LAB
INTRA_ARTERIAL | Status: AC | PRN
  Administered 2021-12-02: 200 ug via INTRA_ARTERIAL

## 2021-12-02 MED ORDER — IODIXANOL 320 MG/ML IV SOLN: 100.0000 mL | Freq: Once | INTRAVENOUS | Status: DC | PRN

## 2021-12-02 MED ORDER — MELOXICAM 7.5 MG PO TABS: 15.0000 mg | ORAL_TABLET | Freq: Every day | ORAL | Status: DC | PRN

## 2021-12-02 MED ORDER — LIDOCAINE HCL 1 % IJ SOLN
INTRAMUSCULAR | Status: AC
  Filled 2021-12-02: qty 20

## 2021-12-02 MED ORDER — SODIUM CHLORIDE 0.9 % IV SOLN: INTRAVENOUS | Status: DC

## 2021-12-02 MED ORDER — ALTEPLASE 2 MG IJ SOLR
INTRAMUSCULAR | Status: AC | PRN
  Administered 2021-12-02 (×2): 2 mg

## 2021-12-02 MED ORDER — CLOPIDOGREL BISULFATE 75 MG PO TABS: 300.0000 mg | ORAL_TABLET | Freq: Once | ORAL | Status: AC

## 2021-12-02 MED ORDER — NITROGLYCERIN 1 MG/10 ML FOR IR/CATH LAB
INTRA_ARTERIAL | Status: AC
  Filled 2021-12-02: qty 10

## 2021-12-02 MED ORDER — FENTANYL CITRATE (PF) 100 MCG/2ML IJ SOLN
INTRAMUSCULAR | Status: AC | PRN
  Administered 2021-12-02: 50 ug via INTRAVENOUS
  Administered 2021-12-02: 25 ug via INTRAVENOUS
  Administered 2021-12-02: 50 ug via INTRAVENOUS
  Administered 2021-12-02 (×3): 25 ug via INTRAVENOUS

## 2021-12-02 MED ORDER — CLOPIDOGREL BISULFATE 75 MG PO TABS: 75.0000 mg | ORAL_TABLET | Freq: Every day | ORAL | 0 refills | Status: AC

## 2021-12-02 MED ORDER — INSULIN ASPART 100 UNIT/ML IJ SOLN
4.0000 [IU] | Freq: Three times a day (TID) | INTRAMUSCULAR | Status: DC
  Administered 2021-12-02 – 2021-12-03 (×3): 4 [IU] via SUBCUTANEOUS

## 2021-12-02 MED ORDER — HEPARIN SODIUM (PORCINE) 1000 UNIT/ML IJ SOLN
INTRAMUSCULAR | Status: AC
  Filled 2021-12-02: qty 10

## 2021-12-02 MED ORDER — HYDROCODONE-ACETAMINOPHEN 5-325 MG PO TABS: 1.0000 | ORAL_TABLET | ORAL | Status: DC | PRN

## 2021-12-02 MED ORDER — ASPIRIN 325 MG PO TABS
ORAL_TABLET | ORAL | Status: AC
  Administered 2021-12-02: 650 mg via ORAL
  Filled 2021-12-02: qty 2

## 2021-12-02 MED ORDER — HEPARIN (PORCINE) 25000 UT/250ML-% IV SOLN
1800.0000 [IU]/h | INTRAVENOUS | Status: DC
  Administered 2021-12-02: 1500 [IU]/h via INTRAVENOUS
  Filled 2021-12-02 (×3): qty 250

## 2021-12-02 MED ORDER — MIDAZOLAM HCL 2 MG/2ML IJ SOLN
INTRAMUSCULAR | Status: AC | PRN
  Administered 2021-12-02: .5 mg via INTRAVENOUS
  Administered 2021-12-02 (×2): 1 mg via INTRAVENOUS
  Administered 2021-12-02 (×3): .5 mg via INTRAVENOUS

## 2021-12-02 MED ORDER — ALTEPLASE 2 MG IJ SOLR
INTRAMUSCULAR | Status: AC
  Filled 2021-12-02: qty 4

## 2021-12-02 MED ORDER — ASPIRIN EC 81 MG PO TBEC
81.0000 mg | DELAYED_RELEASE_TABLET | Freq: Every day | ORAL | Status: DC
  Administered 2021-12-03: 81 mg via ORAL
  Filled 2021-12-02: qty 1

## 2021-12-02 NOTE — Progress Notes (Addendum)
ANTICOAGULATION CONSULT NOTE ? ?Pharmacy Consult for Heparin ?Indication: left tibial artery occlusions ? ?No Known Allergies ? ?Patient Measurements: ?Height: 5\' 10"  (177.8 cm) ?Weight: 101.2 kg (223 lb 1.7 oz) ?IBW/kg (Calculated) : 73 ? ?Heparin Dosing Weight: 94 kg ? ?Vital Signs: ?Temp: 98.1 ?F (36.7 ?C) (03/29 0935) ?Temp Source: Oral (03/29 0935) ?BP: 131/89 (03/29 1700) ?Pulse Rate: 76 (03/29 1700) ? ?Labs: ?Recent Labs  ?  12/02/21 ?0950  ?HGB 12.9*  ?HCT 38.8*  ?PLT 257  ?LABPROT 13.9  ?INR 1.1  ?CREATININE 1.46*  ? ? ?Estimated Creatinine Clearance: 72.2 mL/min (A) (by C-G formula based on SCr of 1.46 mg/dL (H)). ? ? ?Assessment: ?51 y.o. male with past medical history of DM, PAD on DAPT, and chronic non-healing food wounds who presented with LLE gangrene of the forefoot, great toe and second toe. Left lower extremity angiogram shoed occlusions of AT, PT, peroneal arteries at the origins. Pharmacy consulted to initiate heparin.  ? ?During IR procedure, patient received total of heparin 44 units bolus. Will forego bolus. CBC stable, platelets 257.  ? ? ?Goal of Therapy:  ?Heparin level 0.3-0.7 units/ml ?Monitor platelets by anticoagulation protocol: Yes ?  ?Plan:  ?Start heparin infusion at 1500 units/hr ?Check heparin level in 6 hours and daily while on heparin ?Continue to monitor H&H and platelets ? ? ? ?Thank you for allowing pharmacy to be a part of this patient?s care. ? ?[id number], PharmD ?Clinical Pharmacist ? ?

## 2021-12-02 NOTE — Sedation Documentation (Signed)
6 fr celt deployed left groin ? ?

## 2021-12-02 NOTE — Sedation Documentation (Signed)
Patient is resting comfortably. 

## 2021-12-02 NOTE — Procedures (Signed)
Interventional Radiology Procedure Note ? ?Procedure: US guided left CFA access.  US guided left PT and AT access.  ?Left lower extremity angiogram.  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  ?Laser atherectomy and PTA of the occluded PT.  ? ?Findings: ?Occlusions of AT, PT, peroneal arteries at the origins.  ?Restoration of flow through the PT. At remains occluded.  ? ?Post: ?Doppler neg AT unchanged ?+ Doppler PT. ?  ?Forefoot with some mottling, coolness and slow cap refill ?  ?Complications: None ? ?Recommendations:  ?- Based on the forefoot appearance will plan for 23 hr observation ?- start heparin ggt ?- [facility] diet ?- continue dual antiplatelet medications ?- routine wound care  ?- symptom control ?- float heel ?- ice to the left hip prn ? ?Signed, ? ?Yvone Neu. Loreta Ave, DO ? ? ?

## 2021-12-02 NOTE — H&P (Signed)
? ?Chief Complaint: ?Patient was seen in consultation today for non-healing left foot wound ? ?Supervising Physician: Corrie Mckusick ? ?Patient Status: [facility] - Out-pt ? ?History of Present Illness: ?Gabriel Peters is a 51 y.o. male with past medical history of poorly controlled DM and chronic non-healing food wounds complicated by osteomyelitis.  He has undergone prior angiogram with intervention with Dr. Earleen Newport to improve blood flow and has been following with [place] without improvement.  He presents to Radiology today for repeat angiogram with angioplasty vs. Stenting for PAD.  ? ?Gabriel Peters presents in his usual state of health today.  He has been NPO.  He took his medications per his usual schedule including ASA 38m.  He states the wounds to his left foot continue to get worse.  ? ?History reviewed. No pertinent past medical history. ? ?History reviewed. No pertinent surgical history. ? ?Allergies: ?Patient has no known allergies. ? ?Medications: ?Prior to Admission medications   ?Medication Sig Start Date End Date Taking? Authorizing Provider  ?acetaminophen (TYLENOL) 500 MG tablet Take 1,000 mg by mouth daily as needed for moderate pain.   Yes [provider]  ?Aromatic Inhalants (VAPOR INHALER IN) Inhale 1 puff into the lungs daily as needed (congestion).   Yes [provider]  ?aspirin EC 81 MG tablet Take 81 mg by mouth daily. Swallow whole.   Yes [provider]  ?atorvastatin (LIPITOR) 40 MG tablet Take 40 mg by mouth every evening. 04/08/17  Yes [provider]  ?collagenase (SANTYL) 250 UNIT/GM ointment Apply 1 application. topically daily.   Yes [provider]  ?empagliflozin (JARDIANCE) 25 MG TABS tablet Take 25 mg by mouth daily.   Yes [provider]  ?meloxicam (MOBIC) 15 MG tablet Take 15 mg by mouth daily as needed for pain.   Yes [provider]  ?mupirocin ointment (BACTROBAN) 2 % Apply 1 application. topically 2  (two) times daily.   Yes [provider]  ?olmesartan (BENICAR) 20 MG tablet Take 20 mg by mouth daily.   Yes [provider]  ?pioglitazone (ACTOS) 30 MG tablet Take 30 mg by mouth daily.   Yes [provider]  ?Blood Glucose Monitoring Suppl (ONE TOUCH ULTRA 2) w/Device KIT AS DIRECTED,ONCE DAILY 04/25/17   [provider]  ?ONE TOUCH ULTRA TEST test strip AS DIRECTED, ONCE DAILY 04/25/17   [provider]  ?OJonetta SpeakLANCETS FINE MISC AS DIRECTED ONCE DAILY 04/25/17   [provider]  ?  ? ?History reviewed. No pertinent family history. ? ?Social History  ? ?Socioeconomic History  ? Marital status: Single  ?  Spouse name: Not on file  ? Number of children: Not on file  ? Years of education: Not on file  ? Highest education level: Not on file  ?Occupational History  ? Not on file  ?Tobacco Use  ? Smoking status: Never  ? Smokeless tobacco: Current  ?  Types: Snuff  ?Substance and Sexual Activity  ? Alcohol use: Yes  ?  Comment: occasional  ? Drug use: No  ? Sexual activity: Not on file  ?Other Topics Concern  ? Not on file  ?Social History Narrative  ? Not on file  ? ?Social Determinants of Health  ? ?Financial Resource Strain: Not on file  ?Food Insecurity: Not on file  ?Transportation Needs: Not on file  ?Physical Activity: Not on file  ?Stress: Not on file  ?Social Connections: Not on file  ? ? ? ?Review  of Systems: A 12 point ROS discussed and pertinent positives are indicated in the HPI above.  All other systems are negative. ? ?Review of Systems  ?Constitutional:  Negative for fatigue and fever.  ?Respiratory:  Negative for cough and shortness of breath.   ?Cardiovascular:  Negative for chest pain.  ?Gastrointestinal:  Negative for abdominal pain, nausea and vomiting.  ?Musculoskeletal:  Negative for back pain.  ?Psychiatric/Behavioral:  Negative for behavioral problems and confusion.   ? ?Vital Signs: ?Pulse 99   Temp 98.1 ?F (36.7 ?C) (Oral)   Resp  17   Ht _0  (1.778 m)   Wt 223 lb 1.7 oz (101.2 kg)   SpO2 100%   BMI 32.01 kg/m?  ? ?Physical Exam ?Vitals and nursing note reviewed.  ?Constitutional:   ?   General: He is not in acute distress. ?   Appearance: Normal appearance. He is not ill-appearing.  ?HENT:  ?   Mouth/Throat:  ?   Mouth: Mucous membranes are moist.  ?   Pharynx: Oropharynx is clear.  ?Cardiovascular:  ?   Rate and Rhythm: Normal rate and regular rhythm.  ?   Comments: L PT identified by doppler.  ?No L AT pulse identified by palpation or doppler.  ?No L PT identified by palpation or doppler. ? ?Pulmonary:  ?   Effort: Pulmonary effort is normal.  ?Abdominal:  ?   General: Abdomen is flat.  ?Musculoskeletal:  ?   Comments: Multiple wounds to the left foot:  (1) lateral malleolus with open wound surround by granulation tissue with some evidence of healing.  (2) Pressure type injury to the lateral heel, not openly draining.  (3) Large scabbed, moist wound to the great toe involving the tip and nail bed.  (4) Necrotic second toe to the base. (5) Multiple various other wounds to toes 3-5 in various stages of stasis vs. Healing.  ?  ?Skin: ?   General: Skin is warm and dry.  ?Neurological:  ?   General: No focal deficit present.  ?   Mental Status: He is alert and oriented to person, place, and time. Mental status is at baseline.  ?Psychiatric:     ?   Mood and Affect: Mood normal.     ?   Behavior: Behavior normal.     ?   Thought Content: Thought content normal.     ?   Judgment: Judgment normal.  ? ? ? ?MD Evaluation ?Airway: WNL ?Heart: WNL ?Abdomen: WNL ?Chest/ Lungs: WNL ?ASA  Classification: 3 ?Mallampati/Airway Score: Two ? ? ?Imaging: ?No results found. ? ?Labs: ? ?CBC: ?Recent Labs  ?  12/02/21 ?0950  ?WBC 9.1  ?HGB 12.9*  ?HCT 38.8*  ?PLT 257  ? ? ?COAGS: ?Recent Labs  ?  12/02/21 ?0950  ?INR 1.1  ? ? ?BMP: ?Recent Labs  ?  12/02/21 ?0950  ?NA 140  ?K 3.6  ?CL 109  ?CO2 21*  ?GLUCOSE 200*  ?BUN 18  ?CALCIUM 8.9  ?CREATININE 1.46*   ?GFRNONAA 58*  ? ? ?LIVER FUNCTION TESTS: ?No results for input(s): BILITOT, AST, ALT, ALKPHOS, PROT, ALBUMIN in the last 8760 hours. ? ?TUMOR MARKERS: ?No results for input(s): AFPTM, CEA, CA199, CHROMGRNA in the last 8760 hours. ? ?Assessment and Plan: ?Left foot non-healing wounds ?Patient known to IR from prior angiogram with intervention for PAD presents with ongoing non-healing wounds to the left foot.  ?No AT identified by doppler prior to intervention. Left PT intact.  ?Currently followed by  wound care, but does not have a podiatrist.  ?On aspirin 54m at home.  Will likely need prescription for Plavix.  ?Aware of goals of the procedure today and agreeable to proceed.  ? ?Risks and benefits were discussed with the patient including, but not limited to bleeding, infection, vascular injury or contrast induced renal failure. ? ?This interventional procedure involves the use of X-rays and because of the nature of the planned procedure, it is possible that we will have prolonged use of X-ray fluoroscopy. ? ?Potential radiation risks to you include (but are not limited to) the following: ?- A slightly elevated risk for cancer  several years later in life. This risk is typically less ?than 0.5% percent. This risk is low in comparison to the normal incidence of human ?cancer, which is 33% for women and 50% for men according to the American Cancer ?Society. ?- Radiation induced injury can include skin redness, resembling a rash, tissue breakdown / ulcers and hair loss (which can be temporary or permanent).  ? ?The likelihood of either of these occurring depends on the difficulty of the procedure and whether you are sensitive to radiation due to previous procedures, disease, or genetic conditions.  ? ?IF your procedure requires a prolonged use of radiation, you will be notified and given written instructions for further action.  It is your responsibility to monitor the irradiated area for the 2 weeks following the  procedure and to notify your physician if you are concerned that you have suffered a radiation induced injury.   ? ?All of the patient's questions were answered, patient is agreeable to proceed. ? ?Consent sign

## 2021-12-02 NOTE — Sedation Documentation (Signed)
Patient transported to [facility] RN at the bedside to receive patient. Patient transferred to inpatient bed. Groin site assessed. Clean, dry and intact. Small ecchymotic area above puncture site. Soft to palpation, no hematoma noted. Doppler Left post tib pulse. Absent pedal and anterior pulses on left. Left foot was marked due to increased pallor post intervention. Left foot notably dusky at this time. Patient reports no pain at this time.  ?

## 2021-12-03 ENCOUNTER — Other Ambulatory Visit: Payer: Self-pay

## 2021-12-03 DIAGNOSIS — F17290 Nicotine dependence, other tobacco product, uncomplicated: Secondary | ICD-10-CM | POA: Diagnosis not present

## 2021-12-03 DIAGNOSIS — E11621 Type 2 diabetes mellitus with foot ulcer: Secondary | ICD-10-CM | POA: Diagnosis not present

## 2021-12-03 DIAGNOSIS — M869 Osteomyelitis, unspecified: Secondary | ICD-10-CM | POA: Diagnosis not present

## 2021-12-03 DIAGNOSIS — Z7982 Long term (current) use of aspirin: Secondary | ICD-10-CM | POA: Diagnosis not present

## 2021-12-03 DIAGNOSIS — Z7984 Long term (current) use of oral hypoglycemic drugs: Secondary | ICD-10-CM | POA: Diagnosis not present

## 2021-12-03 DIAGNOSIS — E1152 Type 2 diabetes mellitus with diabetic peripheral angiopathy with gangrene: Secondary | ICD-10-CM | POA: Diagnosis not present

## 2021-12-03 DIAGNOSIS — I70262 Atherosclerosis of native arteries of extremities with gangrene, left leg: Secondary | ICD-10-CM | POA: Diagnosis not present

## 2021-12-03 DIAGNOSIS — Z79899 Other long term (current) drug therapy: Secondary | ICD-10-CM | POA: Diagnosis not present

## 2021-12-03 DIAGNOSIS — L97529 Non-pressure chronic ulcer of other part of left foot with unspecified severity: Secondary | ICD-10-CM | POA: Diagnosis not present

## 2021-12-03 LAB — CBC
HCT: 38.6 % — ABNORMAL LOW (ref 39.0–52.0)
Hemoglobin: 12.6 g/dL — ABNORMAL LOW (ref 13.0–17.0)
MCH: 30.1 pg (ref 26.0–34.0)
MCHC: 32.6 g/dL (ref 30.0–36.0)
MCV: 92.1 fL (ref 80.0–100.0)
Platelets: 266 10*3/uL (ref 150–400)
RBC: 4.19 MIL/uL — ABNORMAL LOW (ref 4.22–5.81)
RDW: 13 % (ref 11.5–15.5)
WBC: 8.7 10*3/uL (ref 4.0–10.5)
nRBC: 0 % (ref 0.0–0.2)

## 2021-12-03 LAB — HEPARIN LEVEL (UNFRACTIONATED): Heparin Unfractionated: 0.12 IU/mL — ABNORMAL LOW (ref 0.30–0.70)

## 2021-12-03 LAB — GLUCOSE, CAPILLARY
Glucose-Capillary: 163 mg/dL — ABNORMAL HIGH (ref 70–99)
Glucose-Capillary: 183 mg/dL — ABNORMAL HIGH (ref 70–99)

## 2021-12-03 MED ORDER — HEPARIN BOLUS VIA INFUSION
2000.0000 [IU] | Freq: Once | INTRAVENOUS | Status: AC
Start: 2021-12-03 — End: 2021-12-03
  Administered 2021-12-03: 2000 [IU] via INTRAVENOUS
  Filled 2021-12-03: qty 2000

## 2021-12-03 MED ORDER — CLOPIDOGREL BISULFATE 75 MG PO TABS
75.0000 mg | ORAL_TABLET | Freq: Every day | ORAL | Status: DC
  Administered 2021-12-03: 75 mg via ORAL
  Filled 2021-12-03: qty 1

## 2021-12-03 NOTE — Progress Notes (Signed)
ANTICOAGULATION CONSULT NOTE ? ?Pharmacy Consult for Heparin ?Indication: left tibial artery occlusions ? ?No Known Allergies ? ?Patient Measurements: ?Height: 5\' 10"  (177.8 cm) ?Weight: 101.2 kg (223 lb 1.7 oz) ?IBW/kg (Calculated) : 73 ? ?Heparin Dosing Weight: 94 kg ? ?Vital Signs: ?Temp: 97.9 ?F (36.6 ?C) (03/30 0039) ?Temp Source: Oral (03/30 0039) ?BP: 126/74 (03/30 0039) ?Pulse Rate: 82 (03/30 0039) ? ?Labs: ?Recent Labs  ?  12/02/21 ?0950 12/03/21 ?0306  ?HGB 12.9* 12.6*  ?HCT 38.8* 38.6*  ?PLT 257 266  ?LABPROT 13.9  --   ?INR 1.1  --   ?HEPARINUNFRC  --  0.12*  ?CREATININE 1.46*  --   ? ? ? ?Estimated Creatinine Clearance: 72.2 mL/min (A) (by C-G formula based on SCr of 1.46 mg/dL (H)). ? ? ?Assessment: ?51 y.o. male with past medical history of DM, PAD on DAPT, and chronic non-healing food wounds who presented with LLE gangrene of the forefoot, great toe and second toe. Left lower extremity angiogram shoed occlusions of AT, PT, peroneal arteries at the origins.  ? ?Heparin Level low: 0.12 - no infusion issues or s/sx of bleeding reported from RN ? ? ?Goal of Therapy:  ?Heparin level 0.3-0.7 units/ml ?Monitor platelets by anticoagulation protocol: Yes ?  ?Plan:  ?Bolus 2000 units then increase heparin infusion to 1800 units/hr ?Check heparin level in 6 hours and daily while on heparin ?Continue to monitor H&H and platelets ? ? ? ?44, PharmD ?Clinical Pharmacist ?12/03/2021 4:47 AM ?Please check AMION for all [facility] Pharm[facility] numbers ? ? ?

## 2021-12-03 NOTE — Progress Notes (Signed)
Interventional Radiology Progress Note ? ?SP LLE angiogram and treatment of CLI, PTA tx. Observe overnight secondary to skin changes of forefoot. ? ?No events overnight.  Denies pain.   ?Motor intact. Doppler + PT pulse. Improving color of forefoot.  Cap refill improved.  ?Left access site without hematoma. TTP with ecchymosis.  ? ?Plan; ?- stopping heparin ggt ?- dry dressings for the left great/second toe, lat malleolus, heel. Patient to follow up on schedule with [facility] wound care.  I have spoken to the surgeon yesterday ?- DC now ?- follow up with Dr. Loreta Ave in ~4 weeks.  Earlier if needed.  ? ?Signed, ? ?Yvone Neu. Loreta Ave, DO ? ? ?

## 2021-12-03 NOTE — Discharge Summary (Signed)
? ? ?Patient ID: ?Gabriel Peters ?MRN: [id number] ?DOB/AGE: 50-Dec-1972 50 y.o. ? ?Admit date: 12/02/2021 ?Discharge date: 12/03/2021 ? ?Supervising Physician: Corrie Mckusick ? ?Patient Status: [facility] - In-pt ? ?Admission Diagnoses: Left foot wound, critical limb ischemia ? ?Discharge Diagnoses:  ?Critical lower limb ischemia ?Osteomyelitis ?Type II diabetes mellitus with foot ulcer ? ? ?Discharged Condition: stable ? ?Hospital Course:  ?Armstrong Creasy is a 51 y.o. male with past medical history of poorly controlled DM and chronic non-healing food wounds complicated by osteomyelitis.  He has undergone prior angiogram with intervention with Dr. Earleen Newport to improve blood flow due to PAD and has been following with [facility] without improvement.  He presented to Radiology yesterday 3/29 for repeat angiogram with angioplasty vs. Stenting vs. Atherectomy.  He underwent successful treatment of left anterior and posterior tibial artery occlusion with laser atherectomy and angioplasty of the occluded posterior tibial. Despite  attempts, the AT remained occluded. Post-procedure, there was some mottling, coolness, and slow cap refill of the forefoot. He was admitted for observation overnight.  ? ?Patient assessed by Dr. Earleen Newport this AM.  Uneventful night with PT pulse identifiable by doppler.  Signs of slight improvement in perfusion to foot. Patient stable for discharge home with follow-up with [facility] and surgery.  Patient is aware of follow-up plan and is agreeable to discharge home. He is to start Plavix 12m daily, and continue aspirin 874mdaily.  He is also instructed to continue with his usual medications.  ? ?Plan for follow-up with Dr. WaEarleen Newportn ~4 weeks.  ? ? ?Discharge Exam: ?Blood pressure 115/75, pulse 83, temperature 98 ?F (36.7 ?C), temperature source Oral, resp. rate 16, height 5' 10"  (1.778 m), weight 223 lb 1.7 oz (101.2 kg), SpO2 100 %. ?General appearance: alert, cooperative, and no  distress ?Resp: clear to auscultation bilaterally ?Cardio: regular rate and rhythm, S1, S2 normal, no murmur, click, rub or gallop ?GI: soft, non-tender; bowel sounds normal; no masses,  no organomegaly ?Pulses: Absent left DP and AT, PT identified by doppler ?Skin: Forefoot cool with improved cap refill, wound remain stable.  ? ?Disposition: Discharge disposition: 01-Home or Self Care ? ? ? ? ? ? ?Discharge Instructions   ? ? Diet - low sodium heart healthy   Complete by: As directed ?  ? Discharge instructions   Complete by: As directed ?  ? [facility]  ?Follow-up appointment to be arranged with Dr. WaEarleen NewportSchedulers will contact you with date and time.  ?Start Plavix 75 mg daily for the next 30 days.  ? Discharge wound care:   Complete by: As directed ?  ? Dress wounds as ordered per Dr. WaEarleen Newportor discharge, then maintain regimen from wound care once at home.  ? Increase activity slowly   Complete by: As directed ?  ? ?  ? ?Allergies as of 12/03/2021   ?No Known Allergies ?  ? ?  ?Medication List  ?  ? ?TAKE these medications   ? ?acetaminophen 500 MG tablet ?Commonly known as: TYLENOL ?Take 1,000 mg by mouth daily as needed for moderate pain. ?  ?aspirin EC 81 MG tablet ?Take 81 mg by mouth daily. Swallow whole. ?  ?atorvastatin 40 MG tablet ?Commonly known as: LIPITOR ?Take 40 mg by mouth every evening. ?  ?clopidogrel 75 MG tablet ?Commonly known as: Plavix ?Take 1 tablet (75 mg total) by mouth daily. ?  ?collagenase 250 UNIT/GM ointment ?Commonly known as:  SANTYL ?Apply 1 application. topically daily. ?  ?empagliflozin 25 MG Tabs tablet ?Commonly known as: JARDIANCE ?Take 25 mg by mouth daily. ?  ?meloxicam 15 MG tablet ?Commonly known as: MOBIC ?Take 15 mg by mouth daily as needed for pain. ?  ?mupirocin ointment 2 % ?Commonly known as: BACTROBAN ?Apply 1 application. topically 2 (two) times daily. ?  ?olmesartan 20 MG tablet ?Commonly known as:  BENICAR ?Take 20 mg by mouth daily. ?  ?ONE TOUCH ULTRA 2 w/Device Kit ?AS DIRECTED,ONCE DAILY ?  ?pioglitazone 30 MG tablet ?Commonly known as: ACTOS ?Take 30 mg by mouth daily. ?  ?VAPOR INHALER IN ?Inhale 1 puff into the lungs daily as needed (congestion). ?  ? ?  ? ?  ?  ? ? ?  ?Discharge Care Instructions  ?(From admission, onward)  ?  ? ? ?  ? ?  Start     Ordered  ? 12/03/21 0000  Discharge wound care:       ?Comments: Dress wounds as ordered per Dr. Earleen Newport for discharge, then maintain regimen from wound care once at home.  ? 12/03/21 1315  ? ?  ?  ? ?  ? ? Follow-up Information   ? ? Corrie Mckusick, DO Follow up.   ?Specialties: Interventional Radiology, Radiology ?Contact information: ?[address] ?[address] ?[place] [address] ?[phone] ? ? ?  ?  ? ?  ?  ? ?  ?  ? ?Electronically Signed: ?Docia Barrier, PA ?12/03/2021, 1:32 PM ? ? ?I have spent Greater Than 30 Minutes discharging Gabriel Peters. ? ? ? ? ?

## 2021-12-07 DIAGNOSIS — I96 Gangrene, not elsewhere classified: Secondary | ICD-10-CM | POA: Diagnosis not present

## 2021-12-07 DIAGNOSIS — L97529 Non-pressure chronic ulcer of other part of left foot with unspecified severity: Secondary | ICD-10-CM | POA: Diagnosis not present

## 2021-12-07 DIAGNOSIS — E11621 Type 2 diabetes mellitus with foot ulcer: Secondary | ICD-10-CM | POA: Diagnosis not present

## 2021-12-07 DIAGNOSIS — E1152 Type 2 diabetes mellitus with diabetic peripheral angiopathy with gangrene: Secondary | ICD-10-CM | POA: Diagnosis not present

## 2021-12-09 DIAGNOSIS — Z7902 Long term (current) use of antithrombotics/antiplatelets: Secondary | ICD-10-CM | POA: Diagnosis not present

## 2021-12-09 DIAGNOSIS — I70262 Atherosclerosis of native arteries of extremities with gangrene, left leg: Secondary | ICD-10-CM | POA: Diagnosis not present

## 2021-12-09 DIAGNOSIS — Z7984 Long term (current) use of oral hypoglycemic drugs: Secondary | ICD-10-CM | POA: Diagnosis not present

## 2021-12-09 DIAGNOSIS — L03116 Cellulitis of left lower limb: Secondary | ICD-10-CM | POA: Diagnosis not present

## 2021-12-09 DIAGNOSIS — Z95820 Peripheral vascular angioplasty status with implants and grafts: Secondary | ICD-10-CM | POA: Diagnosis not present

## 2021-12-09 DIAGNOSIS — E1152 Type 2 diabetes mellitus with diabetic peripheral angiopathy with gangrene: Secondary | ICD-10-CM | POA: Diagnosis not present

## 2021-12-09 DIAGNOSIS — I10 Essential (primary) hypertension: Secondary | ICD-10-CM | POA: Diagnosis not present

## 2021-12-09 DIAGNOSIS — I96 Gangrene, not elsewhere classified: Secondary | ICD-10-CM | POA: Diagnosis not present

## 2021-12-10 DIAGNOSIS — I96 Gangrene, not elsewhere classified: Secondary | ICD-10-CM | POA: Diagnosis not present

## 2021-12-14 DIAGNOSIS — I96 Gangrene, not elsewhere classified: Secondary | ICD-10-CM | POA: Diagnosis not present

## 2021-12-14 DIAGNOSIS — E11621 Type 2 diabetes mellitus with foot ulcer: Secondary | ICD-10-CM | POA: Diagnosis not present

## 2021-12-14 DIAGNOSIS — L97529 Non-pressure chronic ulcer of other part of left foot with unspecified severity: Secondary | ICD-10-CM | POA: Diagnosis not present

## 2021-12-14 DIAGNOSIS — E1152 Type 2 diabetes mellitus with diabetic peripheral angiopathy with gangrene: Secondary | ICD-10-CM | POA: Diagnosis not present

## 2021-12-21 DIAGNOSIS — E1152 Type 2 diabetes mellitus with diabetic peripheral angiopathy with gangrene: Secondary | ICD-10-CM | POA: Diagnosis not present

## 2021-12-21 DIAGNOSIS — E11621 Type 2 diabetes mellitus with foot ulcer: Secondary | ICD-10-CM | POA: Diagnosis not present

## 2021-12-21 DIAGNOSIS — I96 Gangrene, not elsewhere classified: Secondary | ICD-10-CM | POA: Diagnosis not present

## 2021-12-21 DIAGNOSIS — L97529 Non-pressure chronic ulcer of other part of left foot with unspecified severity: Secondary | ICD-10-CM | POA: Diagnosis not present

## 2021-12-28 DIAGNOSIS — E11621 Type 2 diabetes mellitus with foot ulcer: Secondary | ICD-10-CM | POA: Diagnosis not present

## 2021-12-28 DIAGNOSIS — L97529 Non-pressure chronic ulcer of other part of left foot with unspecified severity: Secondary | ICD-10-CM | POA: Diagnosis not present

## 2021-12-28 DIAGNOSIS — I96 Gangrene, not elsewhere classified: Secondary | ICD-10-CM | POA: Diagnosis not present

## 2021-12-28 DIAGNOSIS — E1152 Type 2 diabetes mellitus with diabetic peripheral angiopathy with gangrene: Secondary | ICD-10-CM | POA: Diagnosis not present

## 2022-01-01 DIAGNOSIS — G8918 Other acute postprocedural pain: Secondary | ICD-10-CM | POA: Diagnosis not present

## 2022-01-01 DIAGNOSIS — R079 Chest pain, unspecified: Secondary | ICD-10-CM | POA: Diagnosis not present

## 2022-01-01 DIAGNOSIS — L97529 Non-pressure chronic ulcer of other part of left foot with unspecified severity: Secondary | ICD-10-CM | POA: Diagnosis not present

## 2022-01-01 DIAGNOSIS — F17220 Nicotine dependence, chewing tobacco, uncomplicated: Secondary | ICD-10-CM | POA: Diagnosis not present

## 2022-01-01 DIAGNOSIS — E11622 Type 2 diabetes mellitus with other skin ulcer: Secondary | ICD-10-CM | POA: Diagnosis not present

## 2022-01-01 DIAGNOSIS — G8929 Other chronic pain: Secondary | ICD-10-CM | POA: Diagnosis not present

## 2022-01-01 DIAGNOSIS — E11621 Type 2 diabetes mellitus with foot ulcer: Secondary | ICD-10-CM | POA: Diagnosis not present

## 2022-01-01 DIAGNOSIS — E1142 Type 2 diabetes mellitus with diabetic polyneuropathy: Secondary | ICD-10-CM | POA: Diagnosis not present

## 2022-01-01 DIAGNOSIS — L97528 Non-pressure chronic ulcer of other part of left foot with other specified severity: Secondary | ICD-10-CM | POA: Diagnosis not present

## 2022-01-01 DIAGNOSIS — I70262 Atherosclerosis of native arteries of extremities with gangrene, left leg: Secondary | ICD-10-CM | POA: Diagnosis not present

## 2022-01-01 DIAGNOSIS — Z7902 Long term (current) use of antithrombotics/antiplatelets: Secondary | ICD-10-CM | POA: Diagnosis not present

## 2022-01-01 DIAGNOSIS — Z7982 Long term (current) use of aspirin: Secondary | ICD-10-CM | POA: Diagnosis not present

## 2022-01-01 DIAGNOSIS — I10 Essential (primary) hypertension: Secondary | ICD-10-CM | POA: Diagnosis not present

## 2022-01-01 DIAGNOSIS — E1165 Type 2 diabetes mellitus with hyperglycemia: Secondary | ICD-10-CM | POA: Diagnosis not present

## 2022-01-01 DIAGNOSIS — I998 Other disorder of circulatory system: Secondary | ICD-10-CM | POA: Diagnosis not present

## 2022-01-01 DIAGNOSIS — Z89422 Acquired absence of other left toe(s): Secondary | ICD-10-CM | POA: Diagnosis not present

## 2022-01-01 DIAGNOSIS — Z79891 Long term (current) use of opiate analgesic: Secondary | ICD-10-CM | POA: Diagnosis not present

## 2022-01-01 DIAGNOSIS — E785 Hyperlipidemia, unspecified: Secondary | ICD-10-CM | POA: Diagnosis not present

## 2022-01-01 DIAGNOSIS — I96 Gangrene, not elsewhere classified: Secondary | ICD-10-CM | POA: Diagnosis not present

## 2022-01-01 DIAGNOSIS — R2689 Other abnormalities of gait and mobility: Secondary | ICD-10-CM | POA: Diagnosis not present

## 2022-01-01 DIAGNOSIS — Z7984 Long term (current) use of oral hypoglycemic drugs: Secondary | ICD-10-CM | POA: Diagnosis not present

## 2022-01-01 DIAGNOSIS — E1152 Type 2 diabetes mellitus with diabetic peripheral angiopathy with gangrene: Secondary | ICD-10-CM | POA: Diagnosis not present

## 2022-01-02 DIAGNOSIS — I70262 Atherosclerosis of native arteries of extremities with gangrene, left leg: Secondary | ICD-10-CM | POA: Diagnosis not present

## 2022-01-12 DIAGNOSIS — I10 Essential (primary) hypertension: Secondary | ICD-10-CM | POA: Diagnosis not present

## 2022-01-12 DIAGNOSIS — E785 Hyperlipidemia, unspecified: Secondary | ICD-10-CM | POA: Diagnosis not present

## 2022-01-12 DIAGNOSIS — E1169 Type 2 diabetes mellitus with other specified complication: Secondary | ICD-10-CM | POA: Diagnosis not present

## 2022-01-12 DIAGNOSIS — Z7982 Long term (current) use of aspirin: Secondary | ICD-10-CM | POA: Diagnosis not present

## 2022-01-12 DIAGNOSIS — E11621 Type 2 diabetes mellitus with foot ulcer: Secondary | ICD-10-CM | POA: Diagnosis not present

## 2022-01-12 DIAGNOSIS — Z7984 Long term (current) use of oral hypoglycemic drugs: Secondary | ICD-10-CM | POA: Diagnosis not present

## 2022-01-12 DIAGNOSIS — L97509 Non-pressure chronic ulcer of other part of unspecified foot with unspecified severity: Secondary | ICD-10-CM | POA: Diagnosis not present

## 2022-01-12 DIAGNOSIS — E669 Obesity, unspecified: Secondary | ICD-10-CM | POA: Diagnosis not present

## 2022-01-12 DIAGNOSIS — Z4781 Encounter for orthopedic aftercare following surgical amputation: Secondary | ICD-10-CM | POA: Diagnosis not present

## 2022-01-12 DIAGNOSIS — Z89512 Acquired absence of left leg below knee: Secondary | ICD-10-CM | POA: Diagnosis not present

## 2022-01-12 DIAGNOSIS — E1151 Type 2 diabetes mellitus with diabetic peripheral angiopathy without gangrene: Secondary | ICD-10-CM | POA: Diagnosis not present

## 2022-01-12 DIAGNOSIS — G8929 Other chronic pain: Secondary | ICD-10-CM | POA: Diagnosis not present

## 2022-01-12 DIAGNOSIS — I96 Gangrene, not elsewhere classified: Secondary | ICD-10-CM | POA: Diagnosis not present

## 2022-01-13 DIAGNOSIS — G8929 Other chronic pain: Secondary | ICD-10-CM | POA: Diagnosis not present

## 2022-01-13 DIAGNOSIS — Z4781 Encounter for orthopedic aftercare following surgical amputation: Secondary | ICD-10-CM | POA: Diagnosis not present

## 2022-01-13 DIAGNOSIS — E669 Obesity, unspecified: Secondary | ICD-10-CM | POA: Diagnosis not present

## 2022-01-13 DIAGNOSIS — Z7982 Long term (current) use of aspirin: Secondary | ICD-10-CM | POA: Diagnosis not present

## 2022-01-13 DIAGNOSIS — E1169 Type 2 diabetes mellitus with other specified complication: Secondary | ICD-10-CM | POA: Diagnosis not present

## 2022-01-13 DIAGNOSIS — E785 Hyperlipidemia, unspecified: Secondary | ICD-10-CM | POA: Diagnosis not present

## 2022-01-13 DIAGNOSIS — E1151 Type 2 diabetes mellitus with diabetic peripheral angiopathy without gangrene: Secondary | ICD-10-CM | POA: Diagnosis not present

## 2022-01-13 DIAGNOSIS — Z7984 Long term (current) use of oral hypoglycemic drugs: Secondary | ICD-10-CM | POA: Diagnosis not present

## 2022-01-13 DIAGNOSIS — I10 Essential (primary) hypertension: Secondary | ICD-10-CM | POA: Diagnosis not present

## 2022-01-13 DIAGNOSIS — Z89512 Acquired absence of left leg below knee: Secondary | ICD-10-CM | POA: Diagnosis not present

## 2022-01-18 DIAGNOSIS — E1151 Type 2 diabetes mellitus with diabetic peripheral angiopathy without gangrene: Secondary | ICD-10-CM | POA: Diagnosis not present

## 2022-01-18 DIAGNOSIS — E785 Hyperlipidemia, unspecified: Secondary | ICD-10-CM | POA: Diagnosis not present

## 2022-01-18 DIAGNOSIS — G8929 Other chronic pain: Secondary | ICD-10-CM | POA: Diagnosis not present

## 2022-01-18 DIAGNOSIS — Z7984 Long term (current) use of oral hypoglycemic drugs: Secondary | ICD-10-CM | POA: Diagnosis not present

## 2022-01-18 DIAGNOSIS — Z89512 Acquired absence of left leg below knee: Secondary | ICD-10-CM | POA: Diagnosis not present

## 2022-01-18 DIAGNOSIS — Z7982 Long term (current) use of aspirin: Secondary | ICD-10-CM | POA: Diagnosis not present

## 2022-01-18 DIAGNOSIS — Z4781 Encounter for orthopedic aftercare following surgical amputation: Secondary | ICD-10-CM | POA: Diagnosis not present

## 2022-01-18 DIAGNOSIS — E669 Obesity, unspecified: Secondary | ICD-10-CM | POA: Diagnosis not present

## 2022-01-18 DIAGNOSIS — E1169 Type 2 diabetes mellitus with other specified complication: Secondary | ICD-10-CM | POA: Diagnosis not present

## 2022-01-18 DIAGNOSIS — I10 Essential (primary) hypertension: Secondary | ICD-10-CM | POA: Diagnosis not present

## 2022-01-21 DIAGNOSIS — E1169 Type 2 diabetes mellitus with other specified complication: Secondary | ICD-10-CM | POA: Diagnosis not present

## 2022-01-21 DIAGNOSIS — E1151 Type 2 diabetes mellitus with diabetic peripheral angiopathy without gangrene: Secondary | ICD-10-CM | POA: Diagnosis not present

## 2022-01-21 DIAGNOSIS — Z4781 Encounter for orthopedic aftercare following surgical amputation: Secondary | ICD-10-CM | POA: Diagnosis not present

## 2022-01-21 DIAGNOSIS — Z89512 Acquired absence of left leg below knee: Secondary | ICD-10-CM | POA: Diagnosis not present

## 2022-01-21 DIAGNOSIS — E785 Hyperlipidemia, unspecified: Secondary | ICD-10-CM | POA: Diagnosis not present

## 2022-01-21 DIAGNOSIS — I10 Essential (primary) hypertension: Secondary | ICD-10-CM | POA: Diagnosis not present

## 2022-01-21 DIAGNOSIS — Z7982 Long term (current) use of aspirin: Secondary | ICD-10-CM | POA: Diagnosis not present

## 2022-01-21 DIAGNOSIS — Z7984 Long term (current) use of oral hypoglycemic drugs: Secondary | ICD-10-CM | POA: Diagnosis not present

## 2022-01-21 DIAGNOSIS — G8929 Other chronic pain: Secondary | ICD-10-CM | POA: Diagnosis not present

## 2022-01-21 DIAGNOSIS — E669 Obesity, unspecified: Secondary | ICD-10-CM | POA: Diagnosis not present

## 2022-01-25 DIAGNOSIS — E669 Obesity, unspecified: Secondary | ICD-10-CM | POA: Diagnosis not present

## 2022-01-25 DIAGNOSIS — E1169 Type 2 diabetes mellitus with other specified complication: Secondary | ICD-10-CM | POA: Diagnosis not present

## 2022-01-25 DIAGNOSIS — Z4781 Encounter for orthopedic aftercare following surgical amputation: Secondary | ICD-10-CM | POA: Diagnosis not present

## 2022-01-25 DIAGNOSIS — G8929 Other chronic pain: Secondary | ICD-10-CM | POA: Diagnosis not present

## 2022-01-25 DIAGNOSIS — E1151 Type 2 diabetes mellitus with diabetic peripheral angiopathy without gangrene: Secondary | ICD-10-CM | POA: Diagnosis not present

## 2022-01-25 DIAGNOSIS — I10 Essential (primary) hypertension: Secondary | ICD-10-CM | POA: Diagnosis not present

## 2022-01-25 DIAGNOSIS — Z7984 Long term (current) use of oral hypoglycemic drugs: Secondary | ICD-10-CM | POA: Diagnosis not present

## 2022-01-25 DIAGNOSIS — Z7982 Long term (current) use of aspirin: Secondary | ICD-10-CM | POA: Diagnosis not present

## 2022-01-25 DIAGNOSIS — Z89512 Acquired absence of left leg below knee: Secondary | ICD-10-CM | POA: Diagnosis not present

## 2022-01-25 DIAGNOSIS — E785 Hyperlipidemia, unspecified: Secondary | ICD-10-CM | POA: Diagnosis not present

## 2022-01-26 DIAGNOSIS — E1151 Type 2 diabetes mellitus with diabetic peripheral angiopathy without gangrene: Secondary | ICD-10-CM | POA: Diagnosis not present

## 2022-01-26 DIAGNOSIS — G8929 Other chronic pain: Secondary | ICD-10-CM | POA: Diagnosis not present

## 2022-01-26 DIAGNOSIS — Z7982 Long term (current) use of aspirin: Secondary | ICD-10-CM | POA: Diagnosis not present

## 2022-01-26 DIAGNOSIS — E785 Hyperlipidemia, unspecified: Secondary | ICD-10-CM | POA: Diagnosis not present

## 2022-01-26 DIAGNOSIS — Z89512 Acquired absence of left leg below knee: Secondary | ICD-10-CM | POA: Diagnosis not present

## 2022-01-26 DIAGNOSIS — E1169 Type 2 diabetes mellitus with other specified complication: Secondary | ICD-10-CM | POA: Diagnosis not present

## 2022-01-26 DIAGNOSIS — E669 Obesity, unspecified: Secondary | ICD-10-CM | POA: Diagnosis not present

## 2022-01-26 DIAGNOSIS — I10 Essential (primary) hypertension: Secondary | ICD-10-CM | POA: Diagnosis not present

## 2022-01-26 DIAGNOSIS — Z4781 Encounter for orthopedic aftercare following surgical amputation: Secondary | ICD-10-CM | POA: Diagnosis not present

## 2022-01-26 DIAGNOSIS — Z7984 Long term (current) use of oral hypoglycemic drugs: Secondary | ICD-10-CM | POA: Diagnosis not present

## 2022-01-28 DIAGNOSIS — E1169 Type 2 diabetes mellitus with other specified complication: Secondary | ICD-10-CM | POA: Diagnosis not present

## 2022-01-28 DIAGNOSIS — G8929 Other chronic pain: Secondary | ICD-10-CM | POA: Diagnosis not present

## 2022-01-28 DIAGNOSIS — I10 Essential (primary) hypertension: Secondary | ICD-10-CM | POA: Diagnosis not present

## 2022-01-28 DIAGNOSIS — Z89512 Acquired absence of left leg below knee: Secondary | ICD-10-CM | POA: Diagnosis not present

## 2022-01-28 DIAGNOSIS — E785 Hyperlipidemia, unspecified: Secondary | ICD-10-CM | POA: Diagnosis not present

## 2022-01-28 DIAGNOSIS — Z7982 Long term (current) use of aspirin: Secondary | ICD-10-CM | POA: Diagnosis not present

## 2022-01-28 DIAGNOSIS — Z4781 Encounter for orthopedic aftercare following surgical amputation: Secondary | ICD-10-CM | POA: Diagnosis not present

## 2022-01-28 DIAGNOSIS — E669 Obesity, unspecified: Secondary | ICD-10-CM | POA: Diagnosis not present

## 2022-01-28 DIAGNOSIS — E1151 Type 2 diabetes mellitus with diabetic peripheral angiopathy without gangrene: Secondary | ICD-10-CM | POA: Diagnosis not present

## 2022-01-28 DIAGNOSIS — Z7984 Long term (current) use of oral hypoglycemic drugs: Secondary | ICD-10-CM | POA: Diagnosis not present

## 2022-02-02 DIAGNOSIS — E1151 Type 2 diabetes mellitus with diabetic peripheral angiopathy without gangrene: Secondary | ICD-10-CM | POA: Diagnosis not present

## 2022-02-02 DIAGNOSIS — I10 Essential (primary) hypertension: Secondary | ICD-10-CM | POA: Diagnosis not present

## 2022-02-02 DIAGNOSIS — Z7984 Long term (current) use of oral hypoglycemic drugs: Secondary | ICD-10-CM | POA: Diagnosis not present

## 2022-02-02 DIAGNOSIS — Z4781 Encounter for orthopedic aftercare following surgical amputation: Secondary | ICD-10-CM | POA: Diagnosis not present

## 2022-02-02 DIAGNOSIS — G8929 Other chronic pain: Secondary | ICD-10-CM | POA: Diagnosis not present

## 2022-02-02 DIAGNOSIS — Z7982 Long term (current) use of aspirin: Secondary | ICD-10-CM | POA: Diagnosis not present

## 2022-02-02 DIAGNOSIS — Z89512 Acquired absence of left leg below knee: Secondary | ICD-10-CM | POA: Diagnosis not present

## 2022-02-02 DIAGNOSIS — E785 Hyperlipidemia, unspecified: Secondary | ICD-10-CM | POA: Diagnosis not present

## 2022-02-02 DIAGNOSIS — E1169 Type 2 diabetes mellitus with other specified complication: Secondary | ICD-10-CM | POA: Diagnosis not present

## 2022-02-02 DIAGNOSIS — E669 Obesity, unspecified: Secondary | ICD-10-CM | POA: Diagnosis not present

## 2022-02-03 DIAGNOSIS — E669 Obesity, unspecified: Secondary | ICD-10-CM | POA: Diagnosis not present

## 2022-02-03 DIAGNOSIS — E1169 Type 2 diabetes mellitus with other specified complication: Secondary | ICD-10-CM | POA: Diagnosis not present

## 2022-02-03 DIAGNOSIS — E1151 Type 2 diabetes mellitus with diabetic peripheral angiopathy without gangrene: Secondary | ICD-10-CM | POA: Diagnosis not present

## 2022-02-03 DIAGNOSIS — I10 Essential (primary) hypertension: Secondary | ICD-10-CM | POA: Diagnosis not present

## 2022-02-03 DIAGNOSIS — G8929 Other chronic pain: Secondary | ICD-10-CM | POA: Diagnosis not present

## 2022-02-03 DIAGNOSIS — Z89512 Acquired absence of left leg below knee: Secondary | ICD-10-CM | POA: Diagnosis not present

## 2022-02-03 DIAGNOSIS — Z7984 Long term (current) use of oral hypoglycemic drugs: Secondary | ICD-10-CM | POA: Diagnosis not present

## 2022-02-03 DIAGNOSIS — E785 Hyperlipidemia, unspecified: Secondary | ICD-10-CM | POA: Diagnosis not present

## 2022-02-03 DIAGNOSIS — Z7982 Long term (current) use of aspirin: Secondary | ICD-10-CM | POA: Diagnosis not present

## 2022-02-03 DIAGNOSIS — Z4781 Encounter for orthopedic aftercare following surgical amputation: Secondary | ICD-10-CM | POA: Diagnosis not present

## 2022-02-12 DIAGNOSIS — I96 Gangrene, not elsewhere classified: Secondary | ICD-10-CM | POA: Diagnosis not present

## 2022-02-12 DIAGNOSIS — L97509 Non-pressure chronic ulcer of other part of unspecified foot with unspecified severity: Secondary | ICD-10-CM | POA: Diagnosis not present

## 2022-02-12 DIAGNOSIS — E11621 Type 2 diabetes mellitus with foot ulcer: Secondary | ICD-10-CM | POA: Diagnosis not present

## 2022-03-14 DIAGNOSIS — L97509 Non-pressure chronic ulcer of other part of unspecified foot with unspecified severity: Secondary | ICD-10-CM | POA: Diagnosis not present

## 2022-03-14 DIAGNOSIS — I96 Gangrene, not elsewhere classified: Secondary | ICD-10-CM | POA: Diagnosis not present

## 2022-03-14 DIAGNOSIS — E11621 Type 2 diabetes mellitus with foot ulcer: Secondary | ICD-10-CM | POA: Diagnosis not present

## 2022-04-14 DIAGNOSIS — I96 Gangrene, not elsewhere classified: Secondary | ICD-10-CM | POA: Diagnosis not present

## 2022-04-14 DIAGNOSIS — L97509 Non-pressure chronic ulcer of other part of unspecified foot with unspecified severity: Secondary | ICD-10-CM | POA: Diagnosis not present

## 2022-04-14 DIAGNOSIS — T8189XA Other complications of procedures, not elsewhere classified, initial encounter: Secondary | ICD-10-CM | POA: Diagnosis not present

## 2022-04-14 DIAGNOSIS — T8789 Other complications of amputation stump: Secondary | ICD-10-CM | POA: Diagnosis not present

## 2022-04-14 DIAGNOSIS — E11621 Type 2 diabetes mellitus with foot ulcer: Secondary | ICD-10-CM | POA: Diagnosis not present

## 2022-05-15 DIAGNOSIS — I96 Gangrene, not elsewhere classified: Secondary | ICD-10-CM | POA: Diagnosis not present

## 2022-05-15 DIAGNOSIS — E11621 Type 2 diabetes mellitus with foot ulcer: Secondary | ICD-10-CM | POA: Diagnosis not present

## 2022-05-15 DIAGNOSIS — L97509 Non-pressure chronic ulcer of other part of unspecified foot with unspecified severity: Secondary | ICD-10-CM | POA: Diagnosis not present

## 2022-06-14 DIAGNOSIS — L97509 Non-pressure chronic ulcer of other part of unspecified foot with unspecified severity: Secondary | ICD-10-CM | POA: Diagnosis not present

## 2022-06-14 DIAGNOSIS — I96 Gangrene, not elsewhere classified: Secondary | ICD-10-CM | POA: Diagnosis not present

## 2022-06-14 DIAGNOSIS — E11621 Type 2 diabetes mellitus with foot ulcer: Secondary | ICD-10-CM | POA: Diagnosis not present

## 2022-07-15 DIAGNOSIS — E11621 Type 2 diabetes mellitus with foot ulcer: Secondary | ICD-10-CM | POA: Diagnosis not present

## 2022-07-15 DIAGNOSIS — L97509 Non-pressure chronic ulcer of other part of unspecified foot with unspecified severity: Secondary | ICD-10-CM | POA: Diagnosis not present

## 2022-07-15 DIAGNOSIS — I96 Gangrene, not elsewhere classified: Secondary | ICD-10-CM | POA: Diagnosis not present

## 2022-08-14 DIAGNOSIS — L97509 Non-pressure chronic ulcer of other part of unspecified foot with unspecified severity: Secondary | ICD-10-CM | POA: Diagnosis not present

## 2022-08-14 DIAGNOSIS — I96 Gangrene, not elsewhere classified: Secondary | ICD-10-CM | POA: Diagnosis not present

## 2022-08-14 DIAGNOSIS — E11621 Type 2 diabetes mellitus with foot ulcer: Secondary | ICD-10-CM | POA: Diagnosis not present

## 2022-08-18 DIAGNOSIS — Z89512 Acquired absence of left leg below knee: Secondary | ICD-10-CM | POA: Diagnosis not present

## 2022-08-31 DIAGNOSIS — T8189XA Other complications of procedures, not elsewhere classified, initial encounter: Secondary | ICD-10-CM | POA: Diagnosis not present

## 2022-08-31 DIAGNOSIS — M25651 Stiffness of right hip, not elsewhere classified: Secondary | ICD-10-CM | POA: Diagnosis not present

## 2022-08-31 DIAGNOSIS — M79662 Pain in left lower leg: Secondary | ICD-10-CM | POA: Diagnosis not present

## 2022-08-31 DIAGNOSIS — R2681 Unsteadiness on feet: Secondary | ICD-10-CM | POA: Diagnosis not present

## 2022-08-31 DIAGNOSIS — G547 Phantom limb syndrome without pain: Secondary | ICD-10-CM | POA: Diagnosis not present

## 2022-08-31 DIAGNOSIS — Y838 Other surgical procedures as the cause of abnormal reaction of the patient, or of later complication, without mention of misadventure at the time of the procedure: Secondary | ICD-10-CM | POA: Diagnosis not present

## 2022-08-31 DIAGNOSIS — Z89512 Acquired absence of left leg below knee: Secondary | ICD-10-CM | POA: Diagnosis not present

## 2022-08-31 DIAGNOSIS — M25652 Stiffness of left hip, not elsewhere classified: Secondary | ICD-10-CM | POA: Diagnosis not present

## 2022-08-31 DIAGNOSIS — M25662 Stiffness of left knee, not elsewhere classified: Secondary | ICD-10-CM | POA: Diagnosis not present

## 2022-08-31 DIAGNOSIS — R5383 Other fatigue: Secondary | ICD-10-CM | POA: Diagnosis not present

## 2022-09-08 DIAGNOSIS — M25651 Stiffness of right hip, not elsewhere classified: Secondary | ICD-10-CM | POA: Diagnosis not present

## 2022-09-08 DIAGNOSIS — R5383 Other fatigue: Secondary | ICD-10-CM | POA: Diagnosis not present

## 2022-09-08 DIAGNOSIS — Z89512 Acquired absence of left leg below knee: Secondary | ICD-10-CM | POA: Diagnosis not present

## 2022-09-08 DIAGNOSIS — G547 Phantom limb syndrome without pain: Secondary | ICD-10-CM | POA: Diagnosis not present

## 2022-09-08 DIAGNOSIS — M25662 Stiffness of left knee, not elsewhere classified: Secondary | ICD-10-CM | POA: Diagnosis not present

## 2022-09-08 DIAGNOSIS — R2681 Unsteadiness on feet: Secondary | ICD-10-CM | POA: Diagnosis not present

## 2022-09-08 DIAGNOSIS — T8189XD Other complications of procedures, not elsewhere classified, subsequent encounter: Secondary | ICD-10-CM | POA: Diagnosis not present

## 2022-09-08 DIAGNOSIS — M25652 Stiffness of left hip, not elsewhere classified: Secondary | ICD-10-CM | POA: Diagnosis not present

## 2022-09-14 DIAGNOSIS — I96 Gangrene, not elsewhere classified: Secondary | ICD-10-CM | POA: Diagnosis not present

## 2022-09-14 DIAGNOSIS — L97509 Non-pressure chronic ulcer of other part of unspecified foot with unspecified severity: Secondary | ICD-10-CM | POA: Diagnosis not present

## 2022-09-14 DIAGNOSIS — E11621 Type 2 diabetes mellitus with foot ulcer: Secondary | ICD-10-CM | POA: Diagnosis not present

## 2022-09-15 DIAGNOSIS — M25662 Stiffness of left knee, not elsewhere classified: Secondary | ICD-10-CM | POA: Diagnosis not present

## 2022-09-15 DIAGNOSIS — M25651 Stiffness of right hip, not elsewhere classified: Secondary | ICD-10-CM | POA: Diagnosis not present

## 2022-09-15 DIAGNOSIS — R2681 Unsteadiness on feet: Secondary | ICD-10-CM | POA: Diagnosis not present

## 2022-09-15 DIAGNOSIS — Z89512 Acquired absence of left leg below knee: Secondary | ICD-10-CM | POA: Diagnosis not present

## 2022-09-15 DIAGNOSIS — G547 Phantom limb syndrome without pain: Secondary | ICD-10-CM | POA: Diagnosis not present

## 2022-09-15 DIAGNOSIS — R5383 Other fatigue: Secondary | ICD-10-CM | POA: Diagnosis not present

## 2022-09-15 DIAGNOSIS — M25652 Stiffness of left hip, not elsewhere classified: Secondary | ICD-10-CM | POA: Diagnosis not present

## 2022-09-15 DIAGNOSIS — T8189XD Other complications of procedures, not elsewhere classified, subsequent encounter: Secondary | ICD-10-CM | POA: Diagnosis not present

## 2022-09-22 DIAGNOSIS — Z89512 Acquired absence of left leg below knee: Secondary | ICD-10-CM | POA: Diagnosis not present

## 2022-09-22 DIAGNOSIS — R5383 Other fatigue: Secondary | ICD-10-CM | POA: Diagnosis not present

## 2022-09-22 DIAGNOSIS — G547 Phantom limb syndrome without pain: Secondary | ICD-10-CM | POA: Diagnosis not present

## 2022-09-22 DIAGNOSIS — M25651 Stiffness of right hip, not elsewhere classified: Secondary | ICD-10-CM | POA: Diagnosis not present

## 2022-09-22 DIAGNOSIS — M25662 Stiffness of left knee, not elsewhere classified: Secondary | ICD-10-CM | POA: Diagnosis not present

## 2022-09-22 DIAGNOSIS — T8189XD Other complications of procedures, not elsewhere classified, subsequent encounter: Secondary | ICD-10-CM | POA: Diagnosis not present

## 2022-09-22 DIAGNOSIS — M25652 Stiffness of left hip, not elsewhere classified: Secondary | ICD-10-CM | POA: Diagnosis not present

## 2022-09-22 DIAGNOSIS — R2681 Unsteadiness on feet: Secondary | ICD-10-CM | POA: Diagnosis not present

## 2022-09-30 DIAGNOSIS — Z89512 Acquired absence of left leg below knee: Secondary | ICD-10-CM | POA: Diagnosis not present

## 2022-09-30 DIAGNOSIS — M25652 Stiffness of left hip, not elsewhere classified: Secondary | ICD-10-CM | POA: Diagnosis not present

## 2022-09-30 DIAGNOSIS — G547 Phantom limb syndrome without pain: Secondary | ICD-10-CM | POA: Diagnosis not present

## 2022-09-30 DIAGNOSIS — M25651 Stiffness of right hip, not elsewhere classified: Secondary | ICD-10-CM | POA: Diagnosis not present

## 2022-09-30 DIAGNOSIS — R2681 Unsteadiness on feet: Secondary | ICD-10-CM | POA: Diagnosis not present

## 2022-09-30 DIAGNOSIS — M25662 Stiffness of left knee, not elsewhere classified: Secondary | ICD-10-CM | POA: Diagnosis not present

## 2022-09-30 DIAGNOSIS — R5383 Other fatigue: Secondary | ICD-10-CM | POA: Diagnosis not present

## 2022-09-30 DIAGNOSIS — T8189XD Other complications of procedures, not elsewhere classified, subsequent encounter: Secondary | ICD-10-CM | POA: Diagnosis not present

## 2022-10-06 DIAGNOSIS — M25652 Stiffness of left hip, not elsewhere classified: Secondary | ICD-10-CM | POA: Diagnosis not present

## 2022-10-06 DIAGNOSIS — T8189XD Other complications of procedures, not elsewhere classified, subsequent encounter: Secondary | ICD-10-CM | POA: Diagnosis not present

## 2022-10-06 DIAGNOSIS — M25651 Stiffness of right hip, not elsewhere classified: Secondary | ICD-10-CM | POA: Diagnosis not present

## 2022-10-06 DIAGNOSIS — R2681 Unsteadiness on feet: Secondary | ICD-10-CM | POA: Diagnosis not present

## 2022-10-06 DIAGNOSIS — Z89512 Acquired absence of left leg below knee: Secondary | ICD-10-CM | POA: Diagnosis not present

## 2022-10-06 DIAGNOSIS — G547 Phantom limb syndrome without pain: Secondary | ICD-10-CM | POA: Diagnosis not present

## 2022-10-06 DIAGNOSIS — M25662 Stiffness of left knee, not elsewhere classified: Secondary | ICD-10-CM | POA: Diagnosis not present

## 2022-10-06 DIAGNOSIS — R5383 Other fatigue: Secondary | ICD-10-CM | POA: Diagnosis not present

## 2022-10-13 DIAGNOSIS — M79662 Pain in left lower leg: Secondary | ICD-10-CM | POA: Diagnosis not present

## 2022-10-13 DIAGNOSIS — M25651 Stiffness of right hip, not elsewhere classified: Secondary | ICD-10-CM | POA: Diagnosis not present

## 2022-10-13 DIAGNOSIS — G547 Phantom limb syndrome without pain: Secondary | ICD-10-CM | POA: Diagnosis not present

## 2022-10-13 DIAGNOSIS — M25662 Stiffness of left knee, not elsewhere classified: Secondary | ICD-10-CM | POA: Diagnosis not present

## 2022-10-13 DIAGNOSIS — R5383 Other fatigue: Secondary | ICD-10-CM | POA: Diagnosis not present

## 2022-10-13 DIAGNOSIS — R2681 Unsteadiness on feet: Secondary | ICD-10-CM | POA: Diagnosis not present

## 2022-10-13 DIAGNOSIS — T8189XD Other complications of procedures, not elsewhere classified, subsequent encounter: Secondary | ICD-10-CM | POA: Diagnosis not present

## 2022-10-13 DIAGNOSIS — M25652 Stiffness of left hip, not elsewhere classified: Secondary | ICD-10-CM | POA: Diagnosis not present

## 2022-10-13 DIAGNOSIS — X58XXXD Exposure to other specified factors, subsequent encounter: Secondary | ICD-10-CM | POA: Diagnosis not present

## 2022-10-13 DIAGNOSIS — Z89512 Acquired absence of left leg below knee: Secondary | ICD-10-CM | POA: Diagnosis not present

## 2022-10-15 DIAGNOSIS — I96 Gangrene, not elsewhere classified: Secondary | ICD-10-CM | POA: Diagnosis not present

## 2022-10-15 DIAGNOSIS — L97509 Non-pressure chronic ulcer of other part of unspecified foot with unspecified severity: Secondary | ICD-10-CM | POA: Diagnosis not present

## 2022-10-15 DIAGNOSIS — E11621 Type 2 diabetes mellitus with foot ulcer: Secondary | ICD-10-CM | POA: Diagnosis not present

## 2022-10-20 DIAGNOSIS — R5383 Other fatigue: Secondary | ICD-10-CM | POA: Diagnosis not present

## 2022-10-20 DIAGNOSIS — G547 Phantom limb syndrome without pain: Secondary | ICD-10-CM | POA: Diagnosis not present

## 2022-10-20 DIAGNOSIS — M25652 Stiffness of left hip, not elsewhere classified: Secondary | ICD-10-CM | POA: Diagnosis not present

## 2022-10-20 DIAGNOSIS — R2681 Unsteadiness on feet: Secondary | ICD-10-CM | POA: Diagnosis not present

## 2022-10-20 DIAGNOSIS — M25651 Stiffness of right hip, not elsewhere classified: Secondary | ICD-10-CM | POA: Diagnosis not present

## 2022-10-20 DIAGNOSIS — M79662 Pain in left lower leg: Secondary | ICD-10-CM | POA: Diagnosis not present

## 2022-10-20 DIAGNOSIS — M25662 Stiffness of left knee, not elsewhere classified: Secondary | ICD-10-CM | POA: Diagnosis not present

## 2022-10-20 DIAGNOSIS — T8189XD Other complications of procedures, not elsewhere classified, subsequent encounter: Secondary | ICD-10-CM | POA: Diagnosis not present

## 2022-10-20 DIAGNOSIS — Z89512 Acquired absence of left leg below knee: Secondary | ICD-10-CM | POA: Diagnosis not present

## 2022-10-20 DIAGNOSIS — X58XXXD Exposure to other specified factors, subsequent encounter: Secondary | ICD-10-CM | POA: Diagnosis not present

## 2022-10-27 DIAGNOSIS — R2681 Unsteadiness on feet: Secondary | ICD-10-CM | POA: Diagnosis not present

## 2022-10-27 DIAGNOSIS — M25652 Stiffness of left hip, not elsewhere classified: Secondary | ICD-10-CM | POA: Diagnosis not present

## 2022-10-27 DIAGNOSIS — R5383 Other fatigue: Secondary | ICD-10-CM | POA: Diagnosis not present

## 2022-10-27 DIAGNOSIS — M25651 Stiffness of right hip, not elsewhere classified: Secondary | ICD-10-CM | POA: Diagnosis not present

## 2022-10-27 DIAGNOSIS — Z89512 Acquired absence of left leg below knee: Secondary | ICD-10-CM | POA: Diagnosis not present

## 2022-10-27 DIAGNOSIS — G547 Phantom limb syndrome without pain: Secondary | ICD-10-CM | POA: Diagnosis not present

## 2022-10-27 DIAGNOSIS — T8189XD Other complications of procedures, not elsewhere classified, subsequent encounter: Secondary | ICD-10-CM | POA: Diagnosis not present

## 2022-10-27 DIAGNOSIS — M79662 Pain in left lower leg: Secondary | ICD-10-CM | POA: Diagnosis not present

## 2022-10-27 DIAGNOSIS — X58XXXD Exposure to other specified factors, subsequent encounter: Secondary | ICD-10-CM | POA: Diagnosis not present

## 2022-10-27 DIAGNOSIS — M25662 Stiffness of left knee, not elsewhere classified: Secondary | ICD-10-CM | POA: Diagnosis not present

## 2022-11-03 DIAGNOSIS — Z89512 Acquired absence of left leg below knee: Secondary | ICD-10-CM | POA: Diagnosis not present

## 2022-11-03 DIAGNOSIS — M79662 Pain in left lower leg: Secondary | ICD-10-CM | POA: Diagnosis not present

## 2022-11-03 DIAGNOSIS — M25651 Stiffness of right hip, not elsewhere classified: Secondary | ICD-10-CM | POA: Diagnosis not present

## 2022-11-03 DIAGNOSIS — M25662 Stiffness of left knee, not elsewhere classified: Secondary | ICD-10-CM | POA: Diagnosis not present

## 2022-11-03 DIAGNOSIS — M25652 Stiffness of left hip, not elsewhere classified: Secondary | ICD-10-CM | POA: Diagnosis not present

## 2022-11-03 DIAGNOSIS — R2681 Unsteadiness on feet: Secondary | ICD-10-CM | POA: Diagnosis not present

## 2022-11-03 DIAGNOSIS — X58XXXD Exposure to other specified factors, subsequent encounter: Secondary | ICD-10-CM | POA: Diagnosis not present

## 2022-11-03 DIAGNOSIS — R5383 Other fatigue: Secondary | ICD-10-CM | POA: Diagnosis not present

## 2022-11-03 DIAGNOSIS — T8189XD Other complications of procedures, not elsewhere classified, subsequent encounter: Secondary | ICD-10-CM | POA: Diagnosis not present

## 2022-11-03 DIAGNOSIS — G547 Phantom limb syndrome without pain: Secondary | ICD-10-CM | POA: Diagnosis not present

## 2022-11-10 DIAGNOSIS — M25652 Stiffness of left hip, not elsewhere classified: Secondary | ICD-10-CM | POA: Diagnosis not present

## 2022-11-10 DIAGNOSIS — G547 Phantom limb syndrome without pain: Secondary | ICD-10-CM | POA: Diagnosis not present

## 2022-11-10 DIAGNOSIS — R2681 Unsteadiness on feet: Secondary | ICD-10-CM | POA: Diagnosis not present

## 2022-11-10 DIAGNOSIS — Z89512 Acquired absence of left leg below knee: Secondary | ICD-10-CM | POA: Diagnosis not present

## 2022-11-10 DIAGNOSIS — M25662 Stiffness of left knee, not elsewhere classified: Secondary | ICD-10-CM | POA: Diagnosis not present

## 2022-11-10 DIAGNOSIS — M25651 Stiffness of right hip, not elsewhere classified: Secondary | ICD-10-CM | POA: Diagnosis not present

## 2022-11-10 DIAGNOSIS — M79662 Pain in left lower leg: Secondary | ICD-10-CM | POA: Diagnosis not present

## 2022-11-10 DIAGNOSIS — X58XXXD Exposure to other specified factors, subsequent encounter: Secondary | ICD-10-CM | POA: Diagnosis not present

## 2022-11-10 DIAGNOSIS — R5383 Other fatigue: Secondary | ICD-10-CM | POA: Diagnosis not present

## 2022-11-10 DIAGNOSIS — T8189XD Other complications of procedures, not elsewhere classified, subsequent encounter: Secondary | ICD-10-CM | POA: Diagnosis not present

## 2022-11-17 DIAGNOSIS — T8189XD Other complications of procedures, not elsewhere classified, subsequent encounter: Secondary | ICD-10-CM | POA: Diagnosis not present

## 2022-11-17 DIAGNOSIS — X58XXXD Exposure to other specified factors, subsequent encounter: Secondary | ICD-10-CM | POA: Diagnosis not present

## 2022-11-17 DIAGNOSIS — M25652 Stiffness of left hip, not elsewhere classified: Secondary | ICD-10-CM | POA: Diagnosis not present

## 2022-11-17 DIAGNOSIS — G547 Phantom limb syndrome without pain: Secondary | ICD-10-CM | POA: Diagnosis not present

## 2022-11-17 DIAGNOSIS — M25662 Stiffness of left knee, not elsewhere classified: Secondary | ICD-10-CM | POA: Diagnosis not present

## 2022-11-17 DIAGNOSIS — Z89512 Acquired absence of left leg below knee: Secondary | ICD-10-CM | POA: Diagnosis not present

## 2022-11-17 DIAGNOSIS — R2681 Unsteadiness on feet: Secondary | ICD-10-CM | POA: Diagnosis not present

## 2022-11-17 DIAGNOSIS — M79662 Pain in left lower leg: Secondary | ICD-10-CM | POA: Diagnosis not present

## 2022-11-17 DIAGNOSIS — R5383 Other fatigue: Secondary | ICD-10-CM | POA: Diagnosis not present

## 2022-11-17 DIAGNOSIS — M25651 Stiffness of right hip, not elsewhere classified: Secondary | ICD-10-CM | POA: Diagnosis not present

## 2022-11-29 DIAGNOSIS — M12812 Other specific arthropathies, not elsewhere classified, left shoulder: Secondary | ICD-10-CM | POA: Diagnosis not present

## 2022-11-29 DIAGNOSIS — E1151 Type 2 diabetes mellitus with diabetic peripheral angiopathy without gangrene: Secondary | ICD-10-CM | POA: Diagnosis not present

## 2022-11-29 DIAGNOSIS — E782 Mixed hyperlipidemia: Secondary | ICD-10-CM | POA: Diagnosis not present

## 2022-11-29 DIAGNOSIS — I70213 Atherosclerosis of native arteries of extremities with intermittent claudication, bilateral legs: Secondary | ICD-10-CM | POA: Diagnosis not present

## 2022-11-29 DIAGNOSIS — S88112D Complete traumatic amputation at level between knee and ankle, left lower leg, subsequent encounter: Secondary | ICD-10-CM | POA: Diagnosis not present

## 2022-11-29 DIAGNOSIS — Z125 Encounter for screening for malignant neoplasm of prostate: Secondary | ICD-10-CM | POA: Diagnosis not present

## 2022-12-01 DIAGNOSIS — M25652 Stiffness of left hip, not elsewhere classified: Secondary | ICD-10-CM | POA: Diagnosis not present

## 2022-12-01 DIAGNOSIS — R2681 Unsteadiness on feet: Secondary | ICD-10-CM | POA: Diagnosis not present

## 2022-12-01 DIAGNOSIS — M79662 Pain in left lower leg: Secondary | ICD-10-CM | POA: Diagnosis not present

## 2022-12-01 DIAGNOSIS — R5383 Other fatigue: Secondary | ICD-10-CM | POA: Diagnosis not present

## 2022-12-01 DIAGNOSIS — M25662 Stiffness of left knee, not elsewhere classified: Secondary | ICD-10-CM | POA: Diagnosis not present

## 2022-12-01 DIAGNOSIS — T8189XD Other complications of procedures, not elsewhere classified, subsequent encounter: Secondary | ICD-10-CM | POA: Diagnosis not present

## 2022-12-01 DIAGNOSIS — X58XXXD Exposure to other specified factors, subsequent encounter: Secondary | ICD-10-CM | POA: Diagnosis not present

## 2022-12-01 DIAGNOSIS — G547 Phantom limb syndrome without pain: Secondary | ICD-10-CM | POA: Diagnosis not present

## 2022-12-01 DIAGNOSIS — M25651 Stiffness of right hip, not elsewhere classified: Secondary | ICD-10-CM | POA: Diagnosis not present

## 2022-12-01 DIAGNOSIS — Z89512 Acquired absence of left leg below knee: Secondary | ICD-10-CM | POA: Diagnosis not present

## 2022-12-07 DIAGNOSIS — M25512 Pain in left shoulder: Secondary | ICD-10-CM | POA: Diagnosis not present

## 2023-02-05 DEATH — deceased

## 2023-05-13 IMAGING — XA IR ANGIO/EXT/UNI*L*
10 of 17 series · 10 of 24 positions shown · non-contrast
Comparison: none

INDICATION: 51-year-old male presents for left lower extremity angiogram with
critical limb ischemia.

[Series 1: extr.4 care · 2 acquisitions, 1 frame shown (1 of 9)]
[im 1/2]
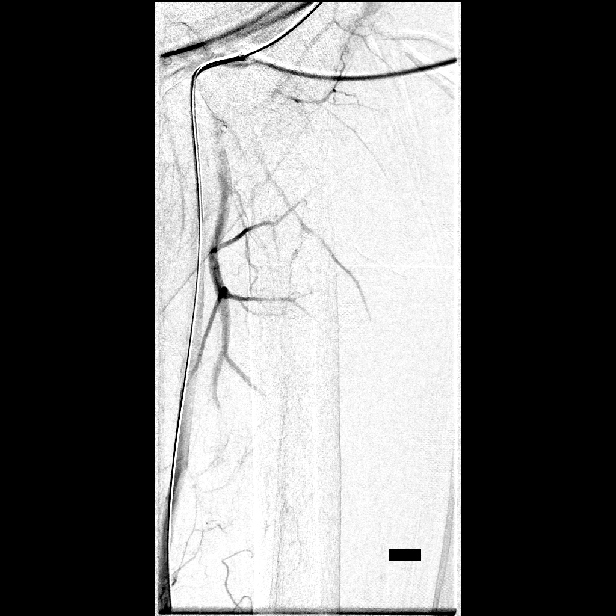

[Series 3: extr.4 care · 2 acquisitions, 1 frame shown (2 of 9)]
[im 1/2]
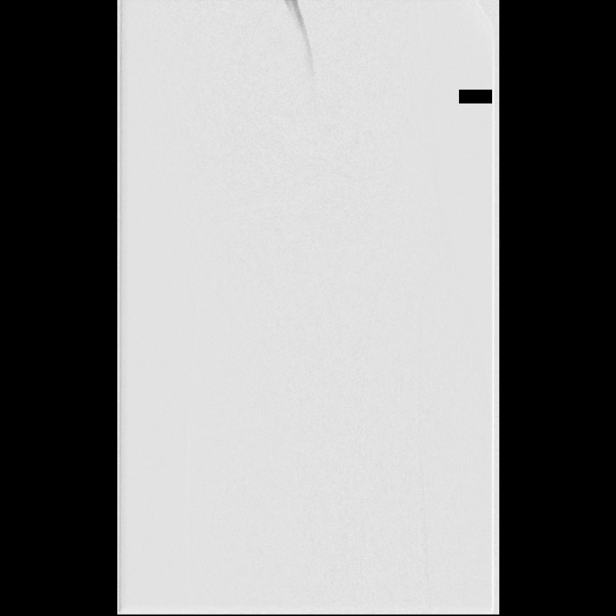

[Series 5: extr.4 care · 2 acquisitions, 1 frame shown (3 of 9)]
[im 1/2]
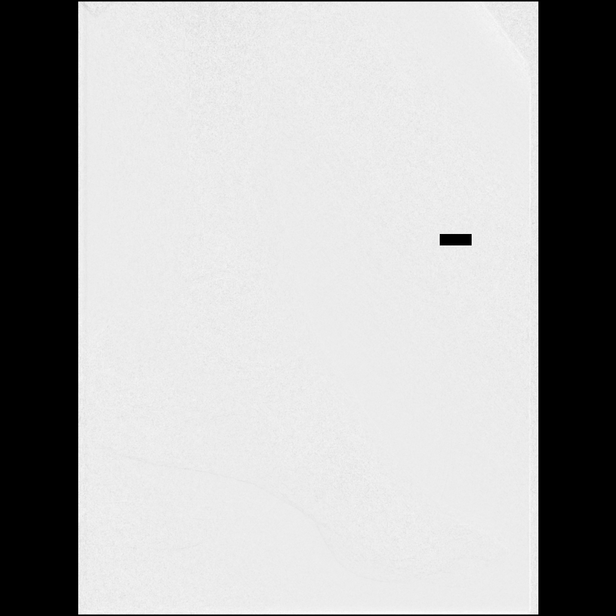

[Series 6: extr.4 care · 2 acquisitions, 1 frame shown (4 of 9)]
[im 1/2]
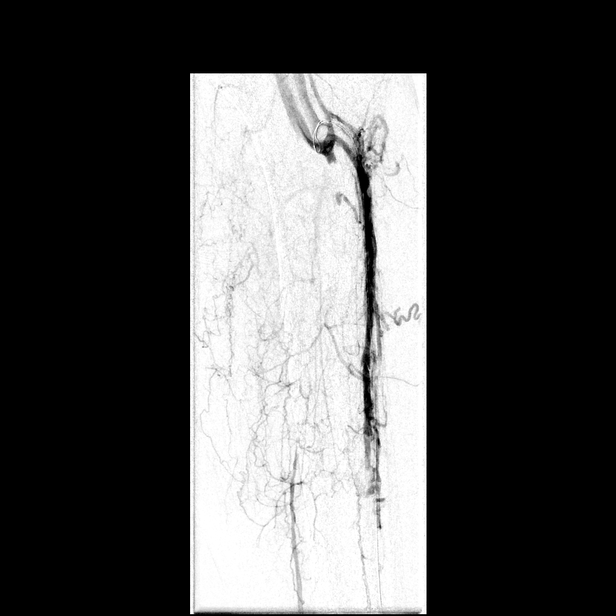

[Series 8: extr.4 care · 2 acquisitions, 1 frame shown (5 of 9)]
[im 1/2]
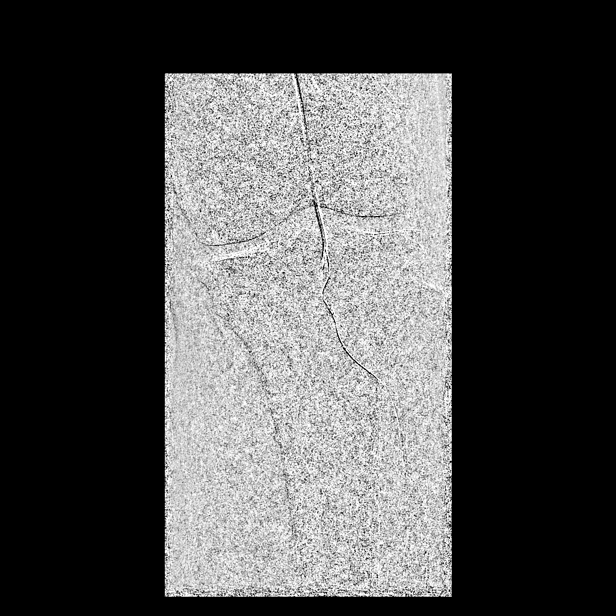

[Series 11: extr.4 care · 1 of 17 frames shown (6 of 9)]
[frame 9/17]
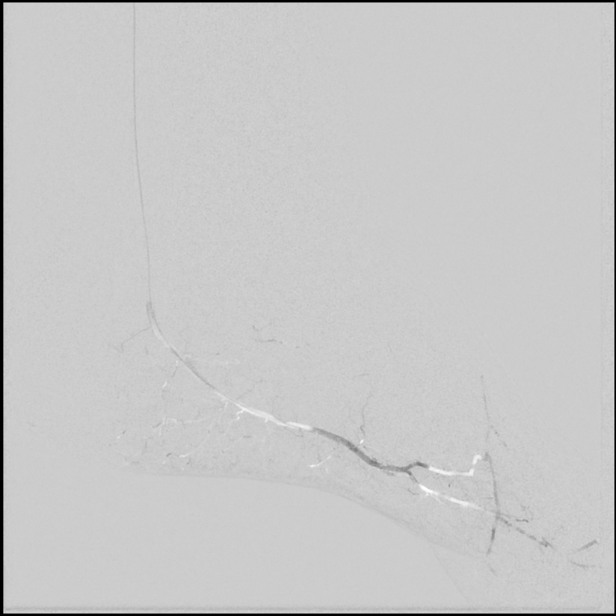

[Series 15: single care · 1 of 2 slices shown]
[im 1/2]
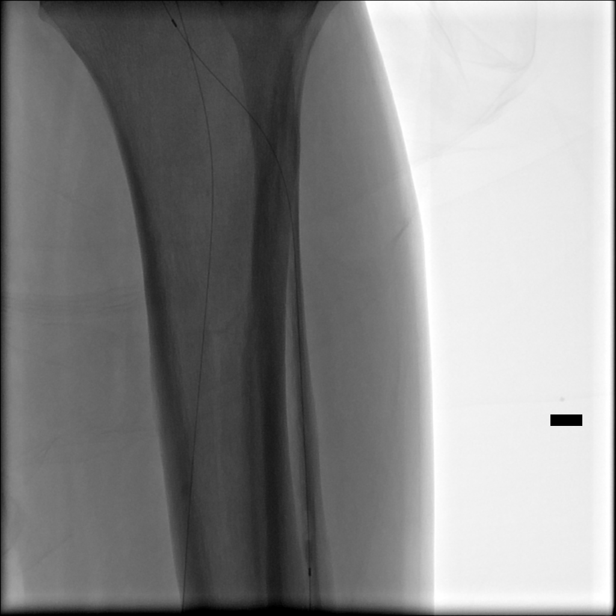

[Series 20: extr.4 care · 2 acquisitions, 1 frame shown (7 of 9)]
[im 1/2]
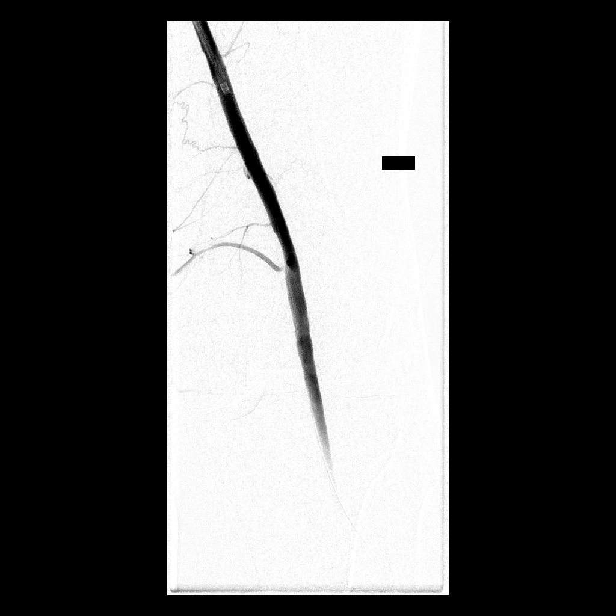

[Series 21: extr.4 care · 2 acquisitions, 1 frame shown (8 of 9)]
[im 1/2]
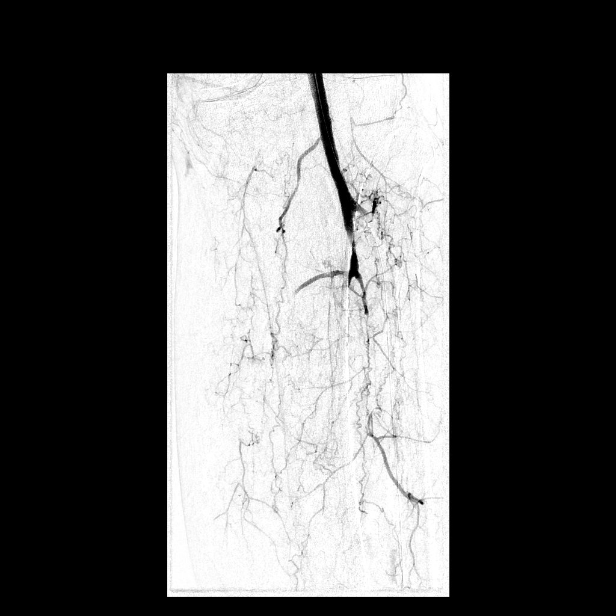

[Series 22: extr.4 care · 2 acquisitions, 1 frame shown (9 of 9)]
[im 1/2]
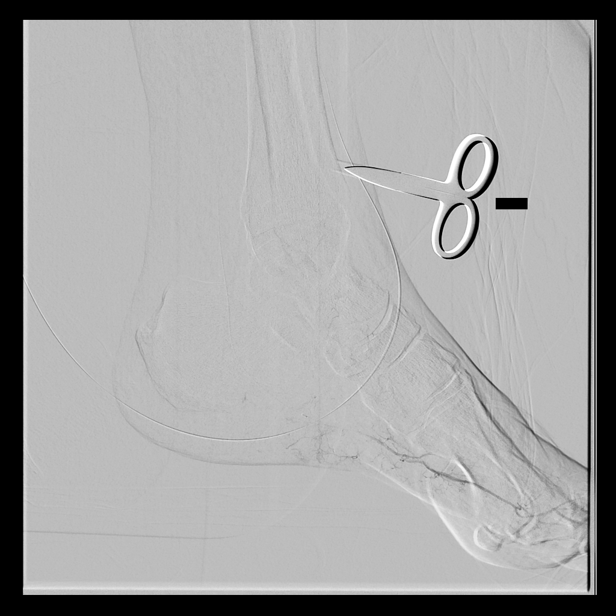

[10 of 24 positions shown; findings below may reference images not displayed]

EXAM:
ULTRASOUND-GUIDED ACCESS LEFT COMMON FEMORAL ARTERY

ULTRASOUND GUIDED ACCESS LEFT ANTERIOR TIBIAL ARTERY

ULTRASOUND-GUIDED ACCESS LEFT POSTERIOR TIBIAL ARTERY

LEFT LOWER EXTREMITY ANGIOGRAM

TREATMENT OF OCCLUDED POSTERIOR TIBIAL ARTERY WITH LASER ATHERECTOMY
AND BALLOON ANGIOPLASTY

CELT DEVICE FOR CLOSURE

MEDICATIONS:
38666 UNITS IV HEPARIN

4 MG TPA

200 MCG NITROGLYCERIN

ANESTHESIA/SEDATION:
Moderate (conscious) sedation was employed during this procedure. A
total of Versed 4.0 mg and Fentanyl 200 mcg was administered
intravenously by the radiology nurse.

Total intra-service moderate Sedation Time: 166 minutes. The
patient's level of consciousness and vital signs were monitored
continuously by radiology nursing throughout the procedure under my
direct supervision.

CONTRAST:  75 CC VISIT TRINCADO

FLUOROSCOPY:
Radiation Exposure Index (as provided by the fluoroscopic device):
75 mGy Kerma

COMPLICATIONS:
SIR LEVEL B - Normal therapy, includes overnight admission for
observation.



Ultrasound survey of the left inguinal region was performed with
images stored and sent to PACs, confirming patency of the vessel.

1% lidocaine was used for local anesthesia. A micropuncture needle
was used access the common femoral artery under ultrasound. With
excellent arterial blood flow returned, and an .018 micro wire was
passed through the needle, observed enter the SFA under fluoroscopy.
The needle was removed, and a micropuncture sheath was placed over
the wire. The inner dilator and wire were removed, and an 035
Bentson wire was advanced under fluoroscopy into the SFA. The sheath
was removed and a standard 5 French vascular sheath was placed. The
dilator was removed and the sheath was flushed.

Angiogram was performed of the left lower extremity

65 cm Kumpe the catheter was passed on the Bentson wire into the
popliteal artery.

Angiogram of the lower leg was performed.

Full-dose heparin was initiated.

The sheath at the left common femoral or access was exchanged for a
6 French 55 cm Brite tip sheath.

Ultrasound-guided access of the distal left anterior tibial artery
was performed, as the intention was to improve flow through the
occluded AT, pedal loop, and the occluded posterior tibial artery.
Once the needle was within the distal anterior tibial artery, a V18
wire was passed retrograde. Needle was removed and 018 quick cross
catheter was passed retrograde on the wire, used to cross the
occluded anterior tibial artery. Wire was placed into the distal
superficial femoral artery.

At that point we addressed the occluded posterior tibial artery. The
Kumpe the catheter was passed antegrade on the Bentson wire and used
to select the origin of the posterior tibial artery. 018 command
wire was passed meeting resistance in the occluded proximal
posterior tibial artery. Given the false channel that was created,
we elected to treat the posterior tibial artery retrograde and
create the luminal channel. 1% lidocaine was administered at the
distal lower leg and ultrasound-guided puncture was performed of the
posterior tibial artery. Retrograde passage of the V18 wire was
performed under fluoroscopy. Needle was removed and 018 quick cross
crossing catheter was passed on the wire retrograde. Combination of
the quick cross catheter and the wire were used to traverse the
occluded posterior tibial artery. The wire was advanced into the
distal superficial femoral artery. The V18 wire was then passed
retrograde through the Kumpe the catheter in a rendezvous technique
and externalized.

018 quick cross catheter was then passed antegrade through the
sheath, and into the distal posterior tibial artery. The wire was
removed and reversed. The 018 command wire was used to navigate into
the lateral plantar artery, into the occluded pedal loop.
Combination of the command wire and the 018 quick cross catheter
were used to traverse the pedal loop, achieving a wire position in
the distal anterior tibial artery. We then exchanged for an 014
command wire and the quick cross catheter was removed.

Balloon angioplasty was then performed of the pedal loop, and the
wire was removed. We then also passed a 2 mm balloon retrograde
through the anterior tibial artery with balloon angioplasty, with a
intention of proceeding antegrade through the anterior tibial
artery, dorsalis pedis and into the pedal loop.

At this point, laser atherectomy was performed at the occluded
segment of the posterior tibial artery. Balloon angioplasty was then
performed along the occluded segment of the posterior tibial artery,
3.5 mm. The 3.5 mm was also performed across the puncture site of
the distal posterior tibial artery.

We then address the anterior tibial artery in an antegrade fashion.
The Kumpe the catheter was passed to the origin of the anterior
tibial artery and 018 command wire was passed distally to the lower
leg. Once the 018 wire was distal, Kumpe the catheter was removed,
and a quick cross catheter was passed over the 018 command wire and
used in an antegrade fashion in attempt to cross the distal
occlusion of the anterior tibial artery. Ultimately an extraluminal
position was discovered.

The angiogram after attempted antegrade treatment of the anterior
tibial artery revealed slow flow through the posterior tibial
artery.

Retreatment of the posterior tibial artery was performed with 3.5 mm
balloon angioplasty along the entire length. Low atmospheric
prolonged inflation was used in 2 segments of the posterior tibial
artery. Balloon was withdrawn and repeat angiogram was performed
confirming in-line flow through the posterior tibial artery to the
forefoot. Slow flow through the forefoot was observed, which appear
to represent spasm.

Pulses were confirmed at the posterior tibial artery. The distal
anterior tibial artery remain occluded.

At this point we elected to withdraw from the case having achieved
in-line flow through the posterior tibial artery.

A Celt device was deployed at the left common femoral artery access
site.

Patient tolerated the procedure well and remained hemodynamically
stable throughout.

Approximately 75 cc estimated blood loss.
FINDINGS: Ultrasound demonstrates patent left common femoral artery

Angiogram reveals no significant femoropopliteal disease. Advanced
tibial disease with occluded anterior tibial artery, peroneal
artery, and posterior tibial artery with significant small vessel
disease of the forefoot.

After treatment of the posterior tibial artery there is in-line flow
through the tibial arteries to the forefoot. Posterior tibial artery
pulse was confirmed at the conclusion.

After concluding the case and deploying hemostasis device, the
sterile prep was removed from the forefoot, revealing modeling and
coolness with slow capillary refill. We elected to observe the
patient overnight with heparin based
IMPRESSION: Status post ultrasound guided access left CFA, left posterior tibial
artery, with laser atherectomy treatment of occluded posterior
tibial artery and balloon angioplasty restoring in-line flow to the
forefoot.

Celt device for hemostasis.

PLAN:
Given the forefoot physical exam findings at the conclusion, we will
be observing the patient overnight with heparin drip.
